# Patient Record
Sex: Female | Born: 1935 | Race: White | Hispanic: No | Smoking: Never smoker
Health system: Southern US, Community
[De-identification: ages and names within clinical notes are randomized; demographics above are authoritative.]

## PROBLEM LIST (undated history)

## (undated) DIAGNOSIS — E669 Obesity, unspecified: Secondary | ICD-10-CM

## (undated) DIAGNOSIS — I2510 Atherosclerotic heart disease of native coronary artery without angina pectoris: Secondary | ICD-10-CM

## (undated) DIAGNOSIS — C44311 Basal cell carcinoma of skin of nose: Secondary | ICD-10-CM

## (undated) DIAGNOSIS — C4370 Malignant melanoma of unspecified lower limb, including hip: Secondary | ICD-10-CM

## (undated) DIAGNOSIS — I10 Essential (primary) hypertension: Secondary | ICD-10-CM

## (undated) DIAGNOSIS — R339 Retention of urine, unspecified: Secondary | ICD-10-CM

## (undated) HISTORY — PX: TONSILLECTOMY: SUR1361

## (undated) HISTORY — PX: BASAL CELL CARCINOMA EXCISION: SHX1214

## (undated) HISTORY — DX: Obesity, unspecified: E66.9

---

## 1998-06-16 ENCOUNTER — Ambulatory Visit (HOSPITAL_COMMUNITY): Admission: RE | Admit: 1998-06-16 | Discharge: 1998-06-16 | Payer: Self-pay | Admitting: Obstetrics & Gynecology

## 1999-04-04 ENCOUNTER — Other Ambulatory Visit: Admission: RE | Admit: 1999-04-04 | Discharge: 1999-04-04 | Payer: Self-pay | Admitting: Internal Medicine

## 2000-04-04 ENCOUNTER — Encounter: Payer: Self-pay | Admitting: Internal Medicine

## 2000-04-04 ENCOUNTER — Encounter: Admission: RE | Admit: 2000-04-04 | Discharge: 2000-04-04 | Payer: Self-pay | Admitting: Internal Medicine

## 2000-09-28 ENCOUNTER — Other Ambulatory Visit: Admission: RE | Admit: 2000-09-28 | Discharge: 2000-09-28 | Payer: Self-pay | Admitting: *Deleted

## 2001-04-04 ENCOUNTER — Encounter: Payer: Self-pay | Admitting: Internal Medicine

## 2001-04-04 ENCOUNTER — Encounter: Admission: RE | Admit: 2001-04-04 | Discharge: 2001-04-04 | Payer: Self-pay | Admitting: Internal Medicine

## 2001-08-30 ENCOUNTER — Encounter: Payer: Self-pay | Admitting: Internal Medicine

## 2001-08-30 ENCOUNTER — Encounter: Admission: RE | Admit: 2001-08-30 | Discharge: 2001-08-30 | Payer: Self-pay | Admitting: Internal Medicine

## 2002-04-08 ENCOUNTER — Encounter: Payer: Self-pay | Admitting: Internal Medicine

## 2002-04-08 ENCOUNTER — Encounter: Admission: RE | Admit: 2002-04-08 | Discharge: 2002-04-08 | Payer: Self-pay | Admitting: Internal Medicine

## 2003-04-08 ENCOUNTER — Other Ambulatory Visit: Admission: RE | Admit: 2003-04-08 | Discharge: 2003-04-08 | Payer: Self-pay | Admitting: Internal Medicine

## 2003-04-10 ENCOUNTER — Encounter: Admission: RE | Admit: 2003-04-10 | Discharge: 2003-04-10 | Payer: Self-pay | Admitting: Internal Medicine

## 2004-04-14 ENCOUNTER — Ambulatory Visit (HOSPITAL_COMMUNITY): Admission: RE | Admit: 2004-04-14 | Discharge: 2004-04-14 | Payer: Self-pay | Admitting: Internal Medicine

## 2005-05-01 ENCOUNTER — Ambulatory Visit (HOSPITAL_COMMUNITY): Admission: RE | Admit: 2005-05-01 | Discharge: 2005-05-01 | Payer: Self-pay | Admitting: Internal Medicine

## 2005-05-12 ENCOUNTER — Ambulatory Visit: Payer: Self-pay | Admitting: Gastroenterology

## 2005-06-08 ENCOUNTER — Ambulatory Visit: Payer: Self-pay | Admitting: Gastroenterology

## 2006-04-02 ENCOUNTER — Other Ambulatory Visit: Admission: RE | Admit: 2006-04-02 | Discharge: 2006-04-02 | Payer: Self-pay | Admitting: Internal Medicine

## 2006-05-04 ENCOUNTER — Ambulatory Visit (HOSPITAL_COMMUNITY): Admission: RE | Admit: 2006-05-04 | Discharge: 2006-05-04 | Payer: Self-pay | Admitting: Internal Medicine

## 2007-05-13 ENCOUNTER — Ambulatory Visit (HOSPITAL_COMMUNITY): Admission: RE | Admit: 2007-05-13 | Discharge: 2007-05-13 | Payer: Self-pay | Admitting: Internal Medicine

## 2008-05-22 ENCOUNTER — Ambulatory Visit (HOSPITAL_COMMUNITY): Admission: RE | Admit: 2008-05-22 | Discharge: 2008-05-22 | Payer: Self-pay | Admitting: Internal Medicine

## 2009-04-07 ENCOUNTER — Other Ambulatory Visit: Admission: RE | Admit: 2009-04-07 | Discharge: 2009-04-07 | Payer: Self-pay | Admitting: Internal Medicine

## 2009-05-24 ENCOUNTER — Ambulatory Visit (HOSPITAL_COMMUNITY): Admission: RE | Admit: 2009-05-24 | Discharge: 2009-05-24 | Payer: Self-pay | Admitting: Internal Medicine

## 2010-05-12 ENCOUNTER — Other Ambulatory Visit (HOSPITAL_COMMUNITY): Payer: Self-pay | Admitting: Internal Medicine

## 2010-05-12 DIAGNOSIS — Z0000 Encounter for general adult medical examination without abnormal findings: Secondary | ICD-10-CM

## 2010-05-27 ENCOUNTER — Ambulatory Visit (HOSPITAL_COMMUNITY): Admission: RE | Admit: 2010-05-27 | Payer: Self-pay | Source: Home / Self Care | Admitting: Internal Medicine

## 2010-05-27 ENCOUNTER — Ambulatory Visit (HOSPITAL_COMMUNITY)
Admission: RE | Admit: 2010-05-27 | Discharge: 2010-05-27 | Disposition: A | Discharge status: Home / Self Care | Payer: Medicare Other | Source: Ambulatory Visit | Attending: Internal Medicine | Admitting: Internal Medicine

## 2010-05-27 DIAGNOSIS — Z1231 Encounter for screening mammogram for malignant neoplasm of breast: Secondary | ICD-10-CM | POA: Insufficient documentation

## 2010-05-27 DIAGNOSIS — Z0000 Encounter for general adult medical examination without abnormal findings: Secondary | ICD-10-CM

## 2011-04-25 HISTORY — PX: MELANOMA EXCISION: SHX5266

## 2011-05-11 ENCOUNTER — Other Ambulatory Visit (HOSPITAL_COMMUNITY): Payer: Self-pay | Admitting: Internal Medicine

## 2011-05-11 DIAGNOSIS — Z1231 Encounter for screening mammogram for malignant neoplasm of breast: Secondary | ICD-10-CM

## 2011-06-09 ENCOUNTER — Ambulatory Visit (HOSPITAL_COMMUNITY)
Admission: RE | Admit: 2011-06-09 | Discharge: 2011-06-09 | Disposition: A | Discharge status: Home / Self Care | Payer: Medicare Other | Source: Ambulatory Visit | Attending: Internal Medicine | Admitting: Internal Medicine

## 2011-06-09 DIAGNOSIS — Z13820 Encounter for screening for osteoporosis: Secondary | ICD-10-CM | POA: Insufficient documentation

## 2011-06-09 DIAGNOSIS — Z780 Asymptomatic menopausal state: Secondary | ICD-10-CM | POA: Insufficient documentation

## 2011-06-09 DIAGNOSIS — Z1231 Encounter for screening mammogram for malignant neoplasm of breast: Secondary | ICD-10-CM

## 2011-08-11 ENCOUNTER — Other Ambulatory Visit: Payer: Self-pay | Admitting: Dermatology

## 2012-05-06 ENCOUNTER — Other Ambulatory Visit (HOSPITAL_COMMUNITY): Payer: Self-pay | Admitting: Internal Medicine

## 2012-05-06 DIAGNOSIS — Z1231 Encounter for screening mammogram for malignant neoplasm of breast: Secondary | ICD-10-CM

## 2012-06-14 ENCOUNTER — Ambulatory Visit (HOSPITAL_COMMUNITY)
Admission: RE | Admit: 2012-06-14 | Discharge: 2012-06-14 | Disposition: A | Discharge status: Home / Self Care | Payer: Medicare Other | Source: Ambulatory Visit | Attending: Internal Medicine | Admitting: Internal Medicine

## 2012-06-14 DIAGNOSIS — Z1231 Encounter for screening mammogram for malignant neoplasm of breast: Secondary | ICD-10-CM | POA: Insufficient documentation

## 2012-09-29 ENCOUNTER — Inpatient Hospital Stay (HOSPITAL_COMMUNITY)
Admission: EM | Admit: 2012-09-29 | Discharge: 2012-10-01 | DRG: 247 | Disposition: A | Discharge status: Home / Self Care | Payer: Medicare Other | Attending: Cardiology | Admitting: Cardiology

## 2012-09-29 ENCOUNTER — Encounter (HOSPITAL_COMMUNITY): Payer: Self-pay | Admitting: *Deleted

## 2012-09-29 ENCOUNTER — Emergency Department (HOSPITAL_COMMUNITY): Payer: Medicare Other

## 2012-09-29 DIAGNOSIS — E876 Hypokalemia: Secondary | ICD-10-CM | POA: Diagnosis present

## 2012-09-29 DIAGNOSIS — I214 Non-ST elevation (NSTEMI) myocardial infarction: Principal | ICD-10-CM | POA: Diagnosis present

## 2012-09-29 DIAGNOSIS — I10 Essential (primary) hypertension: Secondary | ICD-10-CM | POA: Diagnosis present

## 2012-09-29 DIAGNOSIS — Z955 Presence of coronary angioplasty implant and graft: Secondary | ICD-10-CM

## 2012-09-29 DIAGNOSIS — E785 Hyperlipidemia, unspecified: Secondary | ICD-10-CM | POA: Diagnosis present

## 2012-09-29 DIAGNOSIS — I2510 Atherosclerotic heart disease of native coronary artery without angina pectoris: Secondary | ICD-10-CM | POA: Diagnosis present

## 2012-09-29 DIAGNOSIS — Z79899 Other long term (current) drug therapy: Secondary | ICD-10-CM

## 2012-09-29 HISTORY — DX: Malignant melanoma of unspecified lower limb, including hip: C43.70

## 2012-09-29 HISTORY — DX: Basal cell carcinoma of skin of nose: C44.311

## 2012-09-29 HISTORY — DX: Essential (primary) hypertension: I10

## 2012-09-29 HISTORY — DX: Atherosclerotic heart disease of native coronary artery without angina pectoris: I25.10

## 2012-09-29 HISTORY — DX: Retention of urine, unspecified: R33.9

## 2012-09-29 LAB — CBC
MCH: 26.7 pg (ref 26.0–34.0)
MCHC: 32.8 g/dL (ref 30.0–36.0)
MCV: 81.6 fL (ref 78.0–100.0)
Platelets: 208 10*3/uL (ref 150–400)
WBC: 6.7 10*3/uL (ref 4.0–10.5)

## 2012-09-29 LAB — COMPREHENSIVE METABOLIC PANEL
AST: 21 U/L (ref 0–37)
Albumin: 3.2 g/dL — ABNORMAL LOW (ref 3.5–5.2)
BUN: 20 mg/dL (ref 6–23)
Calcium: 9.3 mg/dL (ref 8.4–10.5)
Chloride: 105 mEq/L (ref 96–112)
Creatinine, Ser: 0.89 mg/dL (ref 0.50–1.10)
Total Bilirubin: 0.6 mg/dL (ref 0.3–1.2)
Total Protein: 6.1 g/dL (ref 6.0–8.3)

## 2012-09-29 LAB — LIPASE, BLOOD: Lipase: 32 U/L (ref 11–59)

## 2012-09-29 LAB — BASIC METABOLIC PANEL
BUN: 24 mg/dL — ABNORMAL HIGH (ref 6–23)
Creatinine, Ser: 1.04 mg/dL (ref 0.50–1.10)
GFR calc Af Amer: 57 mL/min — ABNORMAL LOW (ref >90)
GFR calc non Af Amer: 49 mL/min — ABNORMAL LOW (ref >90)
Glucose, Bld: 106 mg/dL — ABNORMAL HIGH (ref 70–99)
Sodium: 140 mEq/L (ref 135–145)

## 2012-09-29 LAB — URINALYSIS, ROUTINE W REFLEX MICROSCOPIC
Glucose, UA: NEGATIVE mg/dL
Hgb urine dipstick: NEGATIVE
Protein, ur: NEGATIVE mg/dL
Specific Gravity, Urine: 1.026 (ref 1.005–1.030)
pH: 6.5 (ref 5.0–8.0)

## 2012-09-29 LAB — HEPARIN LEVEL (UNFRACTIONATED): Heparin Unfractionated: 0.52 IU/mL (ref 0.30–0.70)

## 2012-09-29 LAB — HEPATIC FUNCTION PANEL
ALT: 18 U/L (ref 0–35)
Albumin: 3.6 g/dL (ref 3.5–5.2)
Indirect Bilirubin: 0.6 mg/dL (ref 0.3–0.9)
Total Protein: 6.9 g/dL (ref 6.0–8.3)

## 2012-09-29 LAB — TROPONIN I: Troponin I: <0.3 ng/mL (ref <0.30)

## 2012-09-29 LAB — POCT I-STAT TROPONIN I

## 2012-09-29 LAB — MAGNESIUM: Magnesium: 2 mg/dL (ref 1.5–2.5)

## 2012-09-29 MED ORDER — ASPIRIN EC 81 MG PO TBEC
81.0000 mg | DELAYED_RELEASE_TABLET | Freq: Every day | ORAL | Status: DC
  Administered 2012-10-01: 81 mg via ORAL
  Filled 2012-09-29: qty 1

## 2012-09-29 MED ORDER — ATORVASTATIN CALCIUM 80 MG PO TABS
80.0000 mg | ORAL_TABLET | Freq: Every day | ORAL | Status: DC
  Administered 2012-09-29 – 2012-09-30 (×2): 80 mg via ORAL
  Filled 2012-09-29 (×4): qty 1

## 2012-09-29 MED ORDER — ASPIRIN 300 MG RE SUPP: 300.0000 mg | RECTAL | Status: DC

## 2012-09-29 MED ORDER — SODIUM CHLORIDE 0.9 % IJ SOLN: 3.0000 mL | INTRAMUSCULAR | Status: DC | PRN

## 2012-09-29 MED ORDER — HYDROCHLOROTHIAZIDE 25 MG PO TABS
25.0000 mg | ORAL_TABLET | Freq: Every day | ORAL | Status: DC
  Administered 2012-09-29 – 2012-10-01 (×2): 25 mg via ORAL
  Filled 2012-09-29 (×3): qty 1

## 2012-09-29 MED ORDER — SODIUM CHLORIDE 0.9 % IV SOLN
INTRAVENOUS | Status: DC
  Administered 2012-09-30 (×2): via INTRAVENOUS

## 2012-09-29 MED ORDER — ASPIRIN 81 MG PO CHEW
324.0000 mg | CHEWABLE_TABLET | ORAL | Status: AC
  Administered 2012-09-30: 324 mg via ORAL
  Filled 2012-09-29: qty 4

## 2012-09-29 MED ORDER — ASPIRIN 81 MG PO CHEW
324.0000 mg | CHEWABLE_TABLET | Freq: Once | ORAL | Status: AC
  Administered 2012-09-29: 324 mg via ORAL
  Filled 2012-09-29: qty 4

## 2012-09-29 MED ORDER — ACETAMINOPHEN 325 MG PO TABS: 650.0000 mg | ORAL_TABLET | ORAL | Status: DC | PRN

## 2012-09-29 MED ORDER — HEPARIN BOLUS VIA INFUSION
4000.0000 [IU] | Freq: Once | INTRAVENOUS | Status: AC
  Administered 2012-09-29: 4000 [IU] via INTRAVENOUS

## 2012-09-29 MED ORDER — SODIUM CHLORIDE 0.9 % IJ SOLN
3.0000 mL | Freq: Two times a day (BID) | INTRAMUSCULAR | Status: DC
  Administered 2012-09-29 – 2012-09-30 (×2): 3 mL via INTRAVENOUS

## 2012-09-29 MED ORDER — METOPROLOL TARTRATE 25 MG PO TABS
25.0000 mg | ORAL_TABLET | Freq: Two times a day (BID) | ORAL | Status: DC
  Administered 2012-09-29 – 2012-10-01 (×3): 25 mg via ORAL
  Filled 2012-09-29 (×6): qty 1

## 2012-09-29 MED ORDER — HEPARIN (PORCINE) IN NACL 100-0.45 UNIT/ML-% IJ SOLN
850.0000 [IU]/h | INTRAMUSCULAR | Status: DC
  Administered 2012-09-29: 850 [IU]/h via INTRAVENOUS
  Filled 2012-09-29: qty 250

## 2012-09-29 MED ORDER — NITROGLYCERIN 0.4 MG SL SUBL: 0.4000 mg | SUBLINGUAL_TABLET | SUBLINGUAL | Status: DC | PRN

## 2012-09-29 MED ORDER — ASPIRIN 81 MG PO CHEW: 324.0000 mg | CHEWABLE_TABLET | Freq: Once | ORAL | Status: DC

## 2012-09-29 MED ORDER — ONDANSETRON HCL 4 MG/2ML IJ SOLN: 4.0000 mg | Freq: Four times a day (QID) | INTRAMUSCULAR | Status: DC | PRN

## 2012-09-29 MED ORDER — SODIUM CHLORIDE 0.9 % IV SOLN
250.0000 mL | INTRAVENOUS | Status: DC | PRN
  Administered 2012-09-29: 250 mL via INTRAVENOUS

## 2012-09-29 NOTE — Consult Note (Signed)
ANTICOAGULATION CONSULT NOTE - Follow Up Consult  Pharmacy Consult for Heparin Indication: chest pain/ACS  No Known Allergies  Patient Measurements: Height: 5' 3.5" (161.3 cm) Weight: 165 lb (74.844 kg) IBW/kg (Calculated) : 53.55 Heparin Dosing Weight: ~69kg  Vital Signs: BP: 117/51 mmHg (06/08 1200) Pulse Rate: 56 (06/08 1200)  Labs:  Recent Labs  09/29/12 1020 09/29/12 1031  HGB 13.8  --   HCT 42.1  --   PLT 208  --   CREATININE 1.04  --   TROPONINI  --  0.94*    Estimated Creatinine Clearance: 41.6 ml/min (by C-G formula based on Cr of 1.04).   Medications:  No anticoagulants pta  Assessment: 83yof presents to the ED with CP. Initial troponin is positive. She will begin IV heparin. Baseline renal function and CBC wnl.  Goal of Therapy:  Heparin level 0.3-0.7 units/ml Monitor platelets by anticoagulation protocol: Yes   Plan:  1) Heparin bolus 4000 units x 1 2) Heparin drip at 850 units/hr 3) 8 hour heparin level 4) Daily heparin level and CBC  Fredrik Rigger 09/29/2012,1:15 PM

## 2012-09-29 NOTE — ED Notes (Signed)
Pt reports waking up with a heaviness and discomfort in her chest. No acute distress noted at triage, ekg done.

## 2012-09-29 NOTE — ED Notes (Signed)
Patient transported to X-ray 

## 2012-09-29 NOTE — ED Notes (Signed)
Report attempted 

## 2012-09-29 NOTE — Progress Notes (Signed)
11:11 PM   Heparin level is therapeutic. Xa=0.52. No bleeding noted. Continuing at current rate with f/u am labs 6/9   Janice Coffin

## 2012-09-29 NOTE — ED Notes (Signed)
Critical lab troponin 0.94, Dr Manus Gunning notified.

## 2012-09-29 NOTE — Progress Notes (Signed)
 CARDIOLOGY ADMISSION NOTE  Patient ID: Nancy Wise MRN: [id number] DOB/AGE: 07/15/1929 79 y.o.  Admit date: 09/29/2012 Primary Physician   Dr. Ed Green Primary Cardiologist   None Chief Complaint    Chest pain  HPI:  The patient presented for evaluation of chest discomfort. She has no prior cardiac history. She woke this morning with midsternal chest discomfort. It was a heaviness. As 5/10. She did not describe associated symptoms such as not see a vomiting or diaphoresis. She did not have any palpitations, presyncope or syncope. She did not have radiation to her jaw or to her arms. She's never had discomfort like this before. Because she doesn't complain much her son brought her to the emergency room. Here she has had no EKG changes. However, troponin is slightly elevated at 0.94. She was treated with aspirin and IV heparin in the emergency room. For pain resolved and she has been pain-free since. She's an active person doing "Silver Sneakers".  With this she's had no recent symptoms. She did have some numbness in her arm and leg about a month ago but was evaluated by her primary physician with no clear etiology identified and no further workup was needed.   Past Medical History  Diagnosis Date  . Hypertension   . Bladder retention     History reviewed. No pertinent past surgical history.  No Known Allergies No current facility-administered medications on file prior to encounter.   No current outpatient prescriptions on file prior to encounter.   Prior to Admission medications   Medication Sig Start Date End Date Taking? Authorizing Provider  Calcium Carbonate-Vitamin D (CALCIUM + D PO) Take 1 tablet by mouth daily.   Yes Historical Provider, MD  hydrochlorothiazide (HYDRODIURIL) 25 MG tablet Take 25 mg by mouth daily.   Yes Historical Provider, MD  Multiple Vitamin (MULTIVITAMIN) tablet Take 1 tablet by mouth daily.   Yes Historical Provider, MD  verapamil (COVERA HS) 240 MG  (CO) 24 hr tablet Take 240 mg by mouth daily.   Yes Historical Provider, MD     History   Social History  . Marital Status: Widowed    Spouse Name: N/A    Number of Children: N/A  . Years of Education: N/A   Occupational History  . Not on file.   Social History Main Topics  . Smoking status: Not on file  . Smokeless tobacco: Not on file  . Alcohol Use: No  . Drug Use: No  . Sexually Active: Not on file   Other Topics Concern  . Not on file   Social History Narrative  . No narrative on file    History reviewed. No pertinent family history.   ROS:  As stated in the HPI and negative for all other systems.  Physical Exam: Blood pressure 118/42, pulse 57, resp. rate 16, height 5' 3.5" (1.613 m), weight 165 lb (74.844 kg), SpO2 93.00%.  GENERAL:  Well appearing HEENT:  Pupils equal round and reactive, fundi not visualized, oral mucosa unremarkable NECK:  No jugular venous distention, waveform within normal limits, carotid upstroke brisk and symmetric, no bruits, no thyromegaly LYMPHATICS:  No cervical, inguinal adenopathy LUNGS:  Clear to auscultation bilaterally BACK:  No CVA tenderness CHEST:  Unremarkable HEART:  PMI not displaced or sustained,S1 and S2 within normal limits, no S3, no S4, no clicks, no rubs, no murmurs ABD:  Flat, positive bowel sounds normal in frequency in pitch, no bruits, no rebound, no guarding, no midline pulsatile mass,   no hepatomegaly, no splenomegaly EXT:  2 plus pulses throughout, no edema, no cyanosis no clubbing SKIN:  No rashes no nodules NEURO:  Cranial nerves II through XII grossly intact, motor grossly intact throughout PSYCH:  Cognitively intact, oriented to person place and time  Labs: Lab Results  Component Value Date   BUN 24* 09/29/2012   Lab Results  Component Value Date   CREATININE 1.04 09/29/2012   Lab Results  Component Value Date   NA 140 09/29/2012   K 3.3* 09/29/2012   CL 102 09/29/2012   CO2 29 09/29/2012   Lab Results    Component Value Date   TROPONINI 0.94* 09/29/2012   Lab Results  Component Value Date   WBC 6.7 09/29/2012   HGB 13.8 09/29/2012   HCT 42.1 09/29/2012   MCV 81.6 09/29/2012   PLT 208 09/29/2012   No results found for this basename: CHOL, HDL, LDLCALC, LDLDIRECT, TRIG, CHOLHDL   Lab Results  Component Value Date   ALT 18 09/29/2012   AST 22 09/29/2012   ALKPHOS 55 09/29/2012   BILITOT 0.7 09/29/2012    Radiology:  CXR:  1. Bronchitic changes. 2. No focal pulmonary abnormality.  EKG:  Sinus rhythm, rate 63, axis within normal limits, intervals within normal limits, no acute ST-T wave changes.  ASSESSMENT AND PLAN:    NQWMI:  Pain is consistent with unstable angina. Troponins are elevated. She will be admitted with heparin, aspirin. She's currently pain free so she will need nitrates unless she has recurrent symptoms. Cardiac catheterization. The patient understands that risks included but are not limited to stroke (1 in 1000), death (1 in 1000), kidney failure [usually temporary] (1 in 500), bleeding (1 in 200), allergic reaction [possibly serious] (1 in 200).  The patient understands and agrees to proceed.   HTN:  I'm going to hold her calcium channel blocker for now in favor of the beta blocker.  Signed: Syrita Dovel 09/29/2012, 2:27 PM     

## 2012-09-29 NOTE — ED Provider Notes (Signed)
History     CSN: [id number]  Arrival date & time 09/29/12  8295   First MD Initiated Contact with Patient 09/29/12 1010      Chief Complaint  Patient presents with  . Chest Pain    (Consider location/radiation/quality/duration/timing/severity/associated sxs/prior treatment) HPI Comments: Patient presents with chest "heaviness" and discomfort that started this morning and has been constant for the past 2 hours. She denies any nausea, vomiting, diaphoresis, shortness of breath or cough. Nothing makes the pain is better or worse. She reports no cardiac history is never had a stress test. She has a past medical history of hypertension only. She does not smoke. She reports over the past several weeks she said intermittent pain in her left shoulder and breast area that comes and goes. Denies any leg pain or swelling. Denies any cough, congestion, fever chills.  The history is provided by the patient and a relative.    Past Medical History  Diagnosis Date  . Hypertension   . Bladder retention     History reviewed. No pertinent past surgical history.  History reviewed. No pertinent family history.  History  Substance Use Topics  . Smoking status: Not on file  . Smokeless tobacco: Not on file  . Alcohol Use: No    OB History   Grav Para Term Preterm Abortions TAB SAB Ect Mult Living                  Review of Systems  Constitutional: Negative for fever, activity change and appetite change.  HENT: Negative for congestion and rhinorrhea.   Respiratory: Positive for chest tightness and shortness of breath. Negative for cough.   Cardiovascular: Negative for chest pain.  Gastrointestinal: Negative for nausea, vomiting and abdominal pain.  Genitourinary: Negative for dysuria, hematuria, vaginal bleeding and vaginal discharge.  Musculoskeletal: Negative for back pain.  Neurological: Negative for dizziness, weakness and headaches.  A complete 10 system review of systems was obtained  and all systems are negative except as noted in the HPI and PMH.    Allergies  Review of patient's allergies indicates no known allergies.  Home Medications   Current Outpatient Rx  Name  Route  Sig  Dispense  Refill  . Calcium Carbonate-Vitamin D (CALCIUM + D PO)   Oral   Take 1 tablet by mouth daily.         . hydrochlorothiazide (HYDRODIURIL) 25 MG tablet   Oral   Take 25 mg by mouth daily.         . Multiple Vitamin (MULTIVITAMIN) tablet   Oral   Take 1 tablet by mouth daily.         . verapamil (COVERA HS) 240 MG (CO) 24 hr tablet   Oral   Take 240 mg by mouth daily.           BP 103/56  Pulse 54  Resp 12  SpO2 98%  Physical Exam  Constitutional: She is oriented to person, place, and time. She appears well-developed and well-nourished. No distress.  HENT:  Head: Normocephalic and atraumatic.  Mouth/Throat: Oropharynx is clear and moist. No oropharyngeal exudate.  Eyes: Conjunctivae and EOM are normal. Pupils are equal, round, and reactive to light.  Neck: Normal range of motion. Neck supple.  Cardiovascular: Normal rate, regular rhythm and normal heart sounds.   No murmur heard. Pulmonary/Chest: Effort normal and breath sounds normal. No respiratory distress.  Abdominal: Soft. There is no tenderness. There is no rebound and no guarding.  Musculoskeletal: Normal range of motion. She exhibits no edema and no tenderness.  Neurological: She is alert and oriented to person, place, and time. No cranial nerve deficit. She exhibits normal muscle tone. Coordination normal.  Skin: Skin is warm.    ED Course  Procedures (including critical care time)  Labs Reviewed  BASIC METABOLIC PANEL - Abnormal; Notable for the following:    Potassium 3.3 (*)    Glucose, Bld 106 (*)    BUN 24 (*)    GFR calc non Af Amer 49 (*)    GFR calc Af Amer 57 (*)    All other components within normal limits  CBC - Abnormal; Notable for the following:    RBC 5.16 (*)    All  other components within normal limits  TROPONIN I - Abnormal; Notable for the following:    Troponin I 0.94 (*)    All other components within normal limits  HEPATIC FUNCTION PANEL  LIPASE, BLOOD  URINALYSIS, ROUTINE W REFLEX MICROSCOPIC  POCT I-STAT TROPONIN I   Dg Chest 2 View  09/29/2012   *RADIOLOGY REPORT*  Clinical Data: Chest pain, pressure.  History of hypertension.  CHEST - 2 VIEW  Comparison: None.  Findings: Heart size is normal.  There are no focal consolidations or pleural effusions.  No pulmonary edema.  Perihilar peribronchial changes are noted.  Moderate mid thoracic and upper lumbar degenerative changes are present.  IMPRESSION:  1.  Bronchitic changes. 2. No focal pulmonary abnormality.   Original Report Authenticated By: Norva Pavlov, M.D.     1. NSTEMI (non-ST elevated myocardial infarction)       MDM  2 hour history of chest heaviness that is unchanged. No cardiac history. EKG shows no acute ischemia.  EKG shows no acute ischemic changes. Patient appears comfortable. She is given aspirin on arrival.  Troponin positive at 0.9. Heparin bolus and drip started. Patient states is pain-free at this point. Cardiology consult to Dr. Antoine Poche who will see patient for NSTEMI.    Date: 09/29/2012  Rate: 63  Rhythm: normal sinus rhythm  QRS Axis: normal  Intervals: normal  ST/T Wave abnormalities: normal  Conduction Disutrbances:none  Narrative Interpretation:   Old EKG Reviewed: none available  CRITICAL CARE Performed by: Glynn Octave Total critical care time: 30 Critical care time was exclusive of separately billable procedures and treating other patients. Critical care was necessary to treat or prevent imminent or life-threatening deterioration. Critical care was time spent personally by me on the following activities: development of treatment plan with patient and/or surrogate as well as nursing, discussions with consultants, evaluation of patient's  response to treatment, examination of patient, obtaining history from patient or surrogate, ordering and performing treatments and interventions, ordering and review of laboratory studies, ordering and review of radiographic studies, pulse oximetry and re-evaluation of patient's condition.   Glynn Octave, MD 09/29/12 [phone]

## 2012-09-30 ENCOUNTER — Encounter (HOSPITAL_COMMUNITY)
Admission: EM | Disposition: A | Discharge status: Home / Self Care | Payer: Self-pay | Source: Home / Self Care | Attending: Cardiology

## 2012-09-30 ENCOUNTER — Encounter (HOSPITAL_COMMUNITY): Payer: Self-pay | Admitting: General Practice

## 2012-09-30 DIAGNOSIS — I214 Non-ST elevation (NSTEMI) myocardial infarction: Secondary | ICD-10-CM

## 2012-09-30 DIAGNOSIS — I2510 Atherosclerotic heart disease of native coronary artery without angina pectoris: Secondary | ICD-10-CM

## 2012-09-30 HISTORY — PX: CORONARY ANGIOPLASTY WITH STENT PLACEMENT: SHX49

## 2012-09-30 HISTORY — PX: PERCUTANEOUS CORONARY STENT INTERVENTION (PCI-S): SHX5485

## 2012-09-30 HISTORY — PX: LEFT HEART CATHETERIZATION WITH CORONARY ANGIOGRAM: SHX5451

## 2012-09-30 LAB — CBC
HCT: 38.3 % (ref 36.0–46.0)
Hemoglobin: 12.8 g/dL (ref 12.0–15.0)
MCV: 80.5 fL (ref 78.0–100.0)
RBC: 4.76 MIL/uL (ref 3.87–5.11)
RDW: 13.9 % (ref 11.5–15.5)
WBC: 6.3 10*3/uL (ref 4.0–10.5)

## 2012-09-30 LAB — BASIC METABOLIC PANEL
BUN: 21 mg/dL (ref 6–23)
CO2: 27 mEq/L (ref 19–32)
Chloride: 104 mEq/L (ref 96–112)
Creatinine, Ser: 0.81 mg/dL (ref 0.50–1.10)
GFR calc Af Amer: 77 mL/min — ABNORMAL LOW (ref >90)
Potassium: 3.3 mEq/L — ABNORMAL LOW (ref 3.5–5.1)

## 2012-09-30 LAB — TROPONIN I
Troponin I: <0.3 ng/mL (ref <0.30)
Troponin I: <0.3 ng/mL (ref <0.30)

## 2012-09-30 LAB — LIPID PANEL
LDL Cholesterol: 109 mg/dL — ABNORMAL HIGH (ref 0–99)
Total CHOL/HDL Ratio: 2.9 RATIO
VLDL: 11 mg/dL (ref 0–40)

## 2012-09-30 LAB — URINE CULTURE: Colony Count: 6000

## 2012-09-30 LAB — TSH: TSH: 1.35 u[IU]/mL (ref 0.350–4.500)

## 2012-09-30 SURGERY — LEFT HEART CATHETERIZATION WITH CORONARY ANGIOGRAM
Anesthesia: LOCAL

## 2012-09-30 MED ORDER — CLOPIDOGREL BISULFATE 75 MG PO TABS
75.0000 mg | ORAL_TABLET | Freq: Every day | ORAL | Status: DC
  Administered 2012-10-01: 09:00:00 75 mg via ORAL
  Filled 2012-09-30 (×2): qty 1

## 2012-09-30 MED ORDER — HEART ATTACK BOUNCING BOOK
Freq: Once | Status: AC
  Administered 2012-09-30: 22:00:00
  Filled 2012-09-30: qty 1

## 2012-09-30 MED ORDER — STUDY - INVESTIGATIONAL DRUG SIMPLE RECORD
1.7500 mg/kg/h | Freq: Once | Status: DC
  Filled 2012-09-30: qty 250

## 2012-09-30 MED ORDER — FENTANYL CITRATE 0.05 MG/ML IJ SOLN
INTRAMUSCULAR | Status: AC
  Filled 2012-09-30: qty 2

## 2012-09-30 MED ORDER — HEPARIN (PORCINE) IN NACL 2-0.9 UNIT/ML-% IJ SOLN
INTRAMUSCULAR | Status: AC
  Filled 2012-09-30: qty 1000

## 2012-09-30 MED ORDER — STUDY - INVESTIGATIONAL DRUG SIMPLE RECORD
0.7500 mg/kg | Freq: Once | Status: DC
  Filled 2012-09-30: qty 56.1

## 2012-09-30 MED ORDER — MIDAZOLAM HCL 2 MG/2ML IJ SOLN
INTRAMUSCULAR | Status: AC
  Filled 2012-09-30: qty 2

## 2012-09-30 MED ORDER — HEPARIN SODIUM (PORCINE) 1000 UNIT/ML IJ SOLN
INTRAMUSCULAR | Status: AC
  Filled 2012-09-30: qty 1

## 2012-09-30 MED ORDER — LIDOCAINE HCL (PF) 1 % IJ SOLN
INTRAMUSCULAR | Status: AC
  Filled 2012-09-30: qty 30

## 2012-09-30 MED ORDER — SODIUM CHLORIDE 0.9 % IV SOLN
1.0000 mL/kg/h | INTRAVENOUS | Status: AC
  Administered 2012-09-30: 1 mL/kg/h via INTRAVENOUS

## 2012-09-30 MED ORDER — CLOPIDOGREL BISULFATE 300 MG PO TABS
ORAL_TABLET | ORAL | Status: AC
  Filled 2012-09-30: qty 2

## 2012-09-30 MED ORDER — POTASSIUM CHLORIDE CRYS ER 20 MEQ PO TBCR
40.0000 meq | EXTENDED_RELEASE_TABLET | Freq: Every day | ORAL | Status: DC
  Administered 2012-09-30: 40 meq via ORAL
  Filled 2012-09-30 (×2): qty 2

## 2012-09-30 MED ORDER — VERAPAMIL HCL 2.5 MG/ML IV SOLN
INTRAVENOUS | Status: AC
  Filled 2012-09-30: qty 2

## 2012-09-30 NOTE — Progress Notes (Signed)
Utilization Review Completed Aldean Suddeth J. Deloria Brassfield, RN, BSN, NCM [phone]  

## 2012-09-30 NOTE — CV Procedure (Addendum)
   Cardiac Catheterization Procedure Note  Name: Nancy Wise MRN: [id number] DOB: 08/31/1929  Procedure: Left Heart Cath, Selective Coronary Angiography, LV angiography, PTCA and stenting of the mid LAD  Indication: 79 yo WF with history of HTN presents with NSTEMI. She was previously on one antianginal agent.   Procedural Details:  The right wrist was prepped, draped, and anesthetized with 1% lidocaine. Using the modified Seldinger technique, a 5 French sheath was introduced into the right radial artery. 3 mg of verapamil was administered through the sheath, weight-based unfractionated heparin was administered intravenously. Standard Judkins catheters were used for selective coronary angiography and left ventriculography. Catheter exchanges were performed over an exchange length guidewire.  PROCEDURAL FINDINGS Hemodynamics: AO 131/59 mean 87 mm Hg LV 134/10 mm Hg   Coronary angiography: Coronary dominance: right  Left mainstem: Normal  Left anterior descending (LAD): The LAD gives rise to a very large diagonal branch. There is a focal 80-90% mid LAD stenosis. The diagonal is without significant disease.  Left circumflex (LCx): Normal  Right coronary artery (RCA): Diffuse 30-40% disease in the PLOM. 30% disease in the distal PCA.  Left ventriculography: Left ventricular systolic function is normal, LVEF is estimated at 55-65%, there is no significant mitral regurgitation   PCI Note:  Following the diagnostic procedure, the decision was made to proceed with PCI.  The patient was enrolled in the Regulate trial and was randomized to the Bivalirudin arm.  Once a therapeutic ACT was achieved, a 6 [place] XBLAD 3.5  guide catheter was inserted.  A prowater coronary guidewire was used to cross the lesion.  The lesion was predilated with a 2.0 mm balloon.  The lesion was then stented with a 2.25 x 16 mm Promus premier stent.    Following PCI, there was 0% residual stenosis and TIMI-3 flow.  Final angiography confirmed an excellent result. The patient tolerated the procedure well. There were no immediate procedural complications. A TR band was used for radial hemostasis. The patient was transferred to the post catheterization recovery area for further monitoring.  PCI Data: Vessel - LAD/Segment - mid Percent Stenosis (pre)  80-90% TIMI-flow 3 Stent 2.25 x 16 mm Promus premier Percent Stenosis (post) 0% TIMI-flow (post) 3  Final Conclusions:   1. Single vessel obstructive CAD 2. Normal LV function 3. Successful stenting of the mid LAD with DES.   Recommendations:  Dual antiplatelet therapy for one year. Anticipate DC in am.  Theron Arista [facility] 09/30/2012, 10:09 AM

## 2012-09-30 NOTE — Research (Signed)
Regulate PCI Informed Consent   Subject Name: Nancy Wise  Subject met inclusion and exclusion criteria.  The informed consent form, study requirements and expectations were reviewed with the subject and questions and concerns were addressed prior to the signing of the consent form.  The subject verbalized understanding of the trail requirements.  The subject agreed to participate in the Regulate PCI trial and signed the informed consent.  The informed consent was obtained prior to performance of any protocol-specific procedures for the subject.  A copy of the signed informed consent was given to the subject and a copy was placed in the subject's medical record.  [name]. 09/30/2012, [id number]

## 2012-09-30 NOTE — Interval H&P Note (Signed)
History and Physical Interval Note:  09/30/2012 9:12 AM  Nancy Wise  has presented today for surgery, with the diagnosis of cp  The various methods of treatment have been discussed with the patient and family. After consideration of risks, benefits and other options for treatment, the patient has consented to  Procedure(s): LEFT HEART CATHETERIZATION WITH CORONARY ANGIOGRAM (N/A) as a surgical intervention .  The patient's history has been reviewed, patient examined, no change in status, stable for surgery.  I have reviewed the patient's chart and labs.  Questions were answered to the patient's satisfaction.     Theron Arista [facility] 09/30/2012 9:12 AM

## 2012-09-30 NOTE — H&P (View-Only) (Signed)
CARDIOLOGY ADMISSION NOTE  Patient ID: DELISA FINCK MRN: [id number] DOB/AGE: 10/12/1929 79 y.o.  Admit date: 09/29/2012 Primary Physician   Dr. Elmore Guise Primary Cardiologist   None Chief Complaint    Chest pain  HPI:  The patient presented for evaluation of chest discomfort. She has no prior cardiac history. She woke this morning with midsternal chest discomfort. It was a heaviness. As 5/10. She did not describe associated symptoms such as not see a vomiting or diaphoresis. She did not have any palpitations, presyncope or syncope. She did not have radiation to her jaw or to her arms. She's never had discomfort like this before. Because she doesn't complain much her son brought her to the emergency room. Here she has had no EKG changes. However, troponin is slightly elevated at 0.94. She was treated with aspirin and IV heparin in the emergency room. For pain resolved and she has been pain-free since. She's an active person doing "Silver Sneakers".  With this she's had no recent symptoms. She did have some numbness in her arm and leg about a month ago but was evaluated by her primary physician with no clear etiology identified and no further workup was needed.   Past Medical History  Diagnosis Date  . Hypertension   . Bladder retention     History reviewed. No pertinent past surgical history.  No Known Allergies No current facility-administered medications on file prior to encounter.   No current outpatient prescriptions on file prior to encounter.   Prior to Admission medications   Medication Sig Start Date End Date Taking? Authorizing Provider  Calcium Carbonate-Vitamin D (CALCIUM + D PO) Take 1 tablet by mouth daily.   Yes Historical Provider, MD  hydrochlorothiazide (HYDRODIURIL) 25 MG tablet Take 25 mg by mouth daily.   Yes Historical Provider, MD  Multiple Vitamin (MULTIVITAMIN) tablet Take 1 tablet by mouth daily.   Yes Historical Provider, MD  verapamil (COVERA HS) 240 MG  (CO) 24 hr tablet Take 240 mg by mouth daily.   Yes Historical Provider, MD     History   Social History  . Marital Status: Widowed    Spouse Name: N/A    Number of Children: N/A  . Years of Education: N/A   Occupational History  . Not on file.   Social History Main Topics  . Smoking status: Not on file  . Smokeless tobacco: Not on file  . Alcohol Use: No  . Drug Use: No  . Sexually Active: Not on file   Other Topics Concern  . Not on file   Social History Narrative  . No narrative on file    History reviewed. No pertinent family history.   ROS:  As stated in the HPI and negative for all other systems.  Physical Exam: Blood pressure 118/42, pulse 57, resp. rate 16, height 5' 3.5" (1.613 m), weight 165 lb (74.844 kg), SpO2 93.00%.  GENERAL:  Well appearing HEENT:  Pupils equal round and reactive, fundi not visualized, oral mucosa unremarkable NECK:  No jugular venous distention, waveform within normal limits, carotid upstroke brisk and symmetric, no bruits, no thyromegaly LYMPHATICS:  No cervical, inguinal adenopathy LUNGS:  Clear to auscultation bilaterally BACK:  No CVA tenderness CHEST:  Unremarkable HEART:  PMI not displaced or sustained,S1 and S2 within normal limits, no S3, no S4, no clicks, no rubs, no murmurs ABD:  Flat, positive bowel sounds normal in frequency in pitch, no bruits, no rebound, no guarding, no midline pulsatile mass,  no hepatomegaly, no splenomegaly EXT:  2 plus pulses throughout, no edema, no cyanosis no clubbing SKIN:  No rashes no nodules NEURO:  Cranial nerves II through XII grossly intact, motor grossly intact throughout PSYCH:  Cognitively intact, oriented to person place and time  Labs: Lab Results  Component Value Date   BUN 24* 09/29/2012   Lab Results  Component Value Date   CREATININE 1.04 09/29/2012   Lab Results  Component Value Date   NA 140 09/29/2012   K 3.3* 09/29/2012   CL 102 09/29/2012   CO2 29 09/29/2012   Lab Results    Component Value Date   TROPONINI 0.94* 09/29/2012   Lab Results  Component Value Date   WBC 6.7 09/29/2012   HGB 13.8 09/29/2012   HCT 42.1 09/29/2012   MCV 81.6 09/29/2012   PLT 208 09/29/2012   No results found for this basename: CHOL, HDL, LDLCALC, LDLDIRECT, TRIG, CHOLHDL   Lab Results  Component Value Date   ALT 18 09/29/2012   AST 22 09/29/2012   ALKPHOS 55 09/29/2012   BILITOT 0.7 09/29/2012    Radiology:  CXR:  1. Bronchitic changes. 2. No focal pulmonary abnormality.  EKG:  Sinus rhythm, rate 63, axis within normal limits, intervals within normal limits, no acute ST-T wave changes.  ASSESSMENT AND PLAN:    NQWMI:  Pain is consistent with unstable angina. Troponins are elevated. She will be admitted with heparin, aspirin. She's currently pain free so she will need nitrates unless she has recurrent symptoms. Cardiac catheterization. The patient understands that risks included but are not limited to stroke (1 in 1000), death (1 in 1000), kidney failure [usually temporary] (1 in 500), bleeding (1 in 200), allergic reaction [possibly serious] (1 in 200).  The patient understands and agrees to proceed.   HTN:  I'm going to hold her calcium channel blocker for now in favor of the beta blocker.  SignedRollene Rotunda 09/29/2012, 2:27 PM

## 2012-09-30 NOTE — Progress Notes (Signed)
TR BAND REMOVAL  LOCATION:  right radial  DEFLATED PER PROTOCOL:  yes  TIME BAND OFF / DRESSING APPLIED:   1800   SITE UPON ARRIVAL:   Level 1  SITE AFTER BAND REMOVAL:  Level 1  REVERSE ALLEN'S TEST:    positive  CIRCULATION SENSATION AND MOVEMENT:  Within Normal Limits  yes  COMMENTS:

## 2012-09-30 NOTE — Progress Notes (Signed)
Patient ID: Nancy Wise, female   DOB: 12-08-1929, 79 y.o.   MRN: [id number]   Pt. For cath. Potassium has been ordered.  Jerral Bonito, MD

## 2012-10-01 ENCOUNTER — Encounter (HOSPITAL_COMMUNITY): Payer: Self-pay | Admitting: Nurse Practitioner

## 2012-10-01 DIAGNOSIS — E876 Hypokalemia: Secondary | ICD-10-CM

## 2012-10-01 DIAGNOSIS — I2510 Atherosclerotic heart disease of native coronary artery without angina pectoris: Secondary | ICD-10-CM

## 2012-10-01 DIAGNOSIS — I10 Essential (primary) hypertension: Secondary | ICD-10-CM

## 2012-10-01 DIAGNOSIS — I214 Non-ST elevation (NSTEMI) myocardial infarction: Secondary | ICD-10-CM

## 2012-10-01 LAB — CBC
MCH: 26.8 pg (ref 26.0–34.0)
MCHC: 33.2 g/dL (ref 30.0–36.0)
Platelets: 178 10*3/uL (ref 150–400)

## 2012-10-01 LAB — BASIC METABOLIC PANEL
Calcium: 8.8 mg/dL (ref 8.4–10.5)
Creatinine, Ser: 0.88 mg/dL (ref 0.50–1.10)
GFR calc non Af Amer: 60 mL/min — ABNORMAL LOW (ref >90)
Glucose, Bld: 94 mg/dL (ref 70–99)
Sodium: 143 mEq/L (ref 135–145)

## 2012-10-01 LAB — POCT ACTIVATED CLOTTING TIME: Activated Clotting Time: 649 seconds

## 2012-10-01 MED ORDER — NITROGLYCERIN 0.4 MG SL SUBL: 0.4000 mg | SUBLINGUAL_TABLET | SUBLINGUAL | Status: DC | PRN

## 2012-10-01 MED ORDER — POTASSIUM CHLORIDE CRYS ER 20 MEQ PO TBCR
20.0000 meq | EXTENDED_RELEASE_TABLET | Freq: Every day | ORAL | Status: DC
  Administered 2012-10-01: 20 meq via ORAL

## 2012-10-01 MED ORDER — METOPROLOL TARTRATE 25 MG PO TABS: 25.0000 mg | ORAL_TABLET | Freq: Two times a day (BID) | ORAL | Status: DC

## 2012-10-01 MED ORDER — ASPIRIN 81 MG PO TBEC: 81.0000 mg | DELAYED_RELEASE_TABLET | Freq: Every day | ORAL | Status: DC

## 2012-10-01 MED ORDER — CLOPIDOGREL BISULFATE 75 MG PO TABS: 75.0000 mg | ORAL_TABLET | Freq: Every day | ORAL | Status: DC

## 2012-10-01 MED ORDER — ACTIVE PARTNERSHIP FOR HEALTH OF YOUR HEART BOOK
Freq: Once | Status: AC
  Administered 2012-10-01: 09:00:00
  Filled 2012-10-01 (×2): qty 1

## 2012-10-01 MED ORDER — ATORVASTATIN CALCIUM 80 MG PO TABS: 80.0000 mg | ORAL_TABLET | Freq: Every day | ORAL | Status: DC

## 2012-10-01 MED ORDER — POTASSIUM CHLORIDE CRYS ER 20 MEQ PO TBCR: 40.0000 meq | EXTENDED_RELEASE_TABLET | Freq: Every day | ORAL | Status: DC

## 2012-10-01 NOTE — Progress Notes (Signed)
CARDIAC REHAB PHASE I   PRE:  Rate/Rhythm: 73 SR PJC's  BP:  Supine:   Sitting: 150/51  Standing:    SaO2:   MODE:  Ambulation: 1000 ft   POST:  Rate/Rhythm: 106 ST PJC's  BP:  Supine:   Sitting: 140/65  Standing:    SaO2:  4098-1191 Pt tolerated ambulation well without c/o of cp or SOB. VS stable. Completed MI and stent education with pt. She voices understanding. Pt agrees to Outpt. CRP in [place], will send referral.  Melina Copa RN 10/01/2012 9:26 AM

## 2012-10-01 NOTE — Discharge Summary (Signed)
Patient seen and examined and history reviewed. Agree with above findings and plan. See earlier rounding note.  Girl Schissler JordanMD 10/01/2012 4:48 PM    

## 2012-10-01 NOTE — Progress Notes (Signed)
Patient Name: Nancy Wise Date of Encounter: 10/01/2012   Principal Problem:   NSTEMI (non-ST elevated myocardial infarction) Active Problems:   CAD (coronary artery disease)   Hypertension   Hypokalemia    SUBJECTIVE  No chest pain or sob.  Has not ambulated much yet.  CURRENT MEDS . active partnership for health of your heart book   Does not apply Once  . aspirin EC  81 mg Oral Daily  . atorvastatin  80 mg Oral q1800  . clopidogrel  75 mg Oral Q breakfast  . hydrochlorothiazide  25 mg Oral Daily  . metoprolol tartrate  25 mg Oral BID  . potassium chloride  40 mEq Oral Daily   OBJECTIVE  Filed Vitals:   09/30/12 1649 09/30/12 2044 10/01/12 0025 10/01/12 0521  BP: 113/39 105/46 117/39 99/36  Pulse: 59 67 65 68  Temp: 97.5 F (36.4 C) 97.7 F (36.5 C) 97.9 F (36.6 C) 97.6 F (36.4 C)  TempSrc: Oral Oral Oral Oral  Resp: 18 18 15 18   Height:      Weight:   165 lb 5.5 oz (75 kg)   SpO2: 95% 94% 95% 95%    Intake/Output Summary (Last 24 hours) at 10/01/12 0653 Last data filed at 10/01/12 0531  Gross per 24 hour  Intake 1374.5 ml  Output   1650 ml  Net -275.5 ml   Filed Weights   09/29/12 1300 09/29/12 1718 10/01/12 0025  Weight: 165 lb (74.844 kg) 164 lb 14.5 oz (74.8 kg) 165 lb 5.5 oz (75 kg)    PHYSICAL EXAM  General: Pleasant, NAD. Neuro: Alert and oriented X 3. Moves all extremities spontaneously. Psych: Normal affect. HEENT:  Normal  Neck: Supple without bruits or JVD. Lungs:  Resp regular and unlabored, CTA. Heart: RRR no s3, s4, or murmurs. Abdomen: Soft, non-tender, non-distended, BS + x 4.  Extremities: No clubbing, cyanosis or edema. DP/PT/Radials 2+ and equal bilaterally.  R wrist ecchymotic w/o bleeding, bruit, hematoma.  Nl Allen's.  Accessory Clinical Findings  CBC  Recent Labs  09/30/12 0355 10/01/12 0605  WBC 6.3 6.6  HGB 12.8 12.7  HCT 38.3 38.2  MCV 80.5 80.8  PLT 191 178   Basic Metabolic Panel  Recent Labs  09/29/12 1020 09/29/12 1815 09/30/12 0355  NA 140 141 140  K 3.3* 3.3* 3.3*  CL 102 105 104  CO2 29 30 27   GLUCOSE 106* 114* 95  BUN 24* 20 21  CREATININE 1.04 0.89 0.81  CALCIUM 9.1 9.3 9.1  MG  --  2.0  --    Liver Function Tests  Recent Labs  09/29/12 1020 09/29/12 1815  AST 22 21  ALT 18 16  ALKPHOS 55 48  BILITOT 0.7 0.6  PROT 6.9 6.1  ALBUMIN 3.6 3.2*    Recent Labs  09/29/12 1020  LIPASE 32   Cardiac Enzymes  Recent Labs  09/29/12 1831 09/29/12 2328 09/30/12 0355  TROPONINI <0.30 <0.30 <0.30   Fasting Lipid Panel  Recent Labs  09/30/12 0355  CHOL 183  HDL 63  LDLCALC 109*  TRIG 56  CHOLHDL 2.9   Thyroid Function Tests  Recent Labs  09/29/12 1815  TSH 1.350   TELE  Rsr, frequent pac's.  ECG  Rsr, 63, PAC, twi III, V3-V5 - not significantly changed.  Radiology/Studies  Dg Chest 2 View  09/29/2012   *RADIOLOGY REPORT*  Clinical Data: Chest pain, pressure.  History of hypertension.  CHEST - 2 VIEW  Comparison:  None.  Findings: Heart size is normal.  There are no focal consolidations or pleural effusions.  No pulmonary edema.  Perihilar peribronchial changes are noted.  Moderate mid thoracic and upper lumbar degenerative changes are present.  IMPRESSION:  1.  Bronchitic changes. 2. No focal pulmonary abnormality.   Original Report Authenticated By: Norva Pavlov, M.D.    ASSESSMENT AND PLAN  1.  NSTEMI/CAD:  S/p cath revealing severe LAD dzs, which was successfully stented with a DES.  She has had no recurrent chest pain.  Ambulate with cardiac rehab this AM and plan for d/c afterwards.  Continue asa, statin, plavix, bb.  2.  HTN:  Stable.  Cont bb & HCTZ.  Previously on verapamil -> d/c'd in setting of above with need for bb.  3.  HL:  LDL 109.  Previously statin naive.  Cont high potency lipitor.  4.  Hypokalemia:  bmet pending this AM.  She is now on daily KCl.  Signed, Nicolasa Ducking NP Patient seen and examined and  history reviewed. Agree with above findings and plan. Patient doing very well without recurrent angina. Encouraged outpatient cardiac Rehab program. BMET today is normal. Would discharge on KCL 20 meq daily. Plan follow up in office in 2 weeks.  Theron Arista [facility] 10/01/2012 9:29 AM

## 2012-10-01 NOTE — Discharge Summary (Signed)
Patient ID: Nancy Wise,  MRN: [id number], DOB/AGE: 77-Feb-1931 77 y.o.  Admit date: 09/29/2012 Discharge date: 10/01/2012  Primary Care Provider: Mila Palmer, MD Primary Cardiologist: J. Hochrein, MD   Discharge Diagnoses Principal Problem:   NSTEMI (non-ST elevated myocardial infarction)  **s/p Cath/PCI this admission with stenting of the LAD with a 2.5 x 16 mm Promus Premier DES.  Active Problems:   CAD (coronary artery disease)   Hypertension   Hypokalemia  **Supplemented this admission.  Allergies No Known Allergies  Procedures  Cardiac Catheterization and Percutaneous Coronary Intervention 6.9.2014  PROCEDURAL FINDINGS Hemodynamics: AO 131/59 mean 87 mm Hg LV 134/10 mm Hg              Coronary angiography: Coronary dominance: right  Left mainstem: Normal Left anterior descending (LAD): The LAD gives rise to a very large diagonal branch. There is a focal 80-90% mid LAD stenosis. The diagonal is without significant disease.   **The mid LAD was successfully stented using a 2.25 x 16 mm Promus Premier DES**  Left circumflex (LCx): Normal Right coronary artery (RCA): Diffuse 30-40% disease in the PLOM. 30% disease in the distal PCA. Left ventriculography: Left ventricular systolic function is normal, LVEF is estimated at 55-65%, there is no significant mitral regurgitation  _____________   History of Present Illness  79 y/o female without prior cardiac history.  She was in her usual state of health until the morning of admission when she awoke with midsternal chest heaviness without associated symptoms. Her son brought her to the [place] where ECG was without acute changes and troponin was found to be mildly elevated at 0.94. She was treated with aspirin and IV heparin and admitted for further evaluation and management of non-ST segment elevation myocardial infarction.  Hospital Course  Following admission, the patient had no further chest pain. Subsequent troponins  returned normal at less than 0.30. In addition to heparin, she was placed on aspirin, beta blocker, and hypodensity statin therapy. She underwent diagnostic catheterization which revealed significant mid LAD stenosis and otherwise nonobstructive disease and normal LV function. The LAD was successfully treated using a 2.25 by 16mm Promus Premier drug-eluting stent. She tolerated this procedure well and post procedure has been ambulatory without recurrent symptoms or limitations. She has been hypokalemic throughout her admission and has been supplemented. She will be discharged with supplemental potassium as well. We plan to discharge her this morning in good condition and have arranged for followup in our office in 7 days.  Discharge Vitals Blood pressure 150/51, pulse 85, temperature 97.7 F (36.5 C), temperature source Oral, resp. rate 18, height 5\' 3"  (1.6 m), weight 165 lb 5.5 oz (75 kg), SpO2 95.00%.  Filed Weights   09/29/12 1300 09/29/12 1718 10/01/12 0025  Weight: 165 lb (74.844 kg) 164 lb 14.5 oz (74.8 kg) 165 lb 5.5 oz (75 kg)    Labs  CBC  Recent Labs  09/30/12 0355 10/01/12 0605  WBC 6.3 6.6  HGB 12.8 12.7  HCT 38.3 38.2  MCV 80.5 80.8  PLT 191 178   Basic Metabolic Panel  Recent Labs  09/29/12 1020 09/29/12 1815 09/30/12 0355 10/01/12 0605  NA 140 141 140 143  K 3.3* 3.3* 3.3* 3.6  CL 102 105 104 108  CO2 29 30 27 27   GLUCOSE 106* 114* 95 94  BUN 24* 20 21 18   CREATININE 1.04 0.89 0.81 0.88  CALCIUM 9.1 9.3 9.1 8.8  MG  --  2.0  --   --  Liver Function Tests  Recent Labs  09/29/12 1020 09/29/12 1815  AST 22 21  ALT 18 16  ALKPHOS 55 48  BILITOT 0.7 0.6  PROT 6.9 6.1  ALBUMIN 3.6 3.2*    Recent Labs  09/29/12 1020  LIPASE 32   Cardiac Enzymes  Recent Labs  09/29/12 1831 09/29/12 2328 09/30/12 0355  TROPONINI <0.30 <0.30 <0.30   Fasting Lipid Panel  Recent Labs  09/30/12 0355  CHOL 183  HDL 63  LDLCALC 109*  TRIG 56  CHOLHDL  2.9   Thyroid Function Tests  Recent Labs  09/29/12 1815  TSH 1.350   Disposition  Pt is being discharged home today in good condition.  Follow-up Plans & Appointments      Follow-up Information   Follow up with Rick Duff, PA-C On 10/08/2012. (2:00 - Dr. Jenene Slicker PA)    Contact information:   [address] [place] [address] [phone]       Discharge Medications    Medication List    STOP taking these medications       verapamil 240 MG (CO) 24 hr tablet  Commonly known as:  COVERA HS      TAKE these medications       aspirin 81 MG EC tablet  Take 1 tablet (81 mg total) by mouth daily.     atorvastatin 80 MG tablet  Commonly known as:  LIPITOR  Take 1 tablet (80 mg total) by mouth daily at 6 PM.     CALCIUM + D PO  Take 1 tablet by mouth daily.     clopidogrel 75 MG tablet  Commonly known as:  PLAVIX  Take 1 tablet (75 mg total) by mouth daily with breakfast.     hydrochlorothiazide 25 MG tablet  Commonly known as:  HYDRODIURIL  Take 25 mg by mouth daily.     metoprolol tartrate 25 MG tablet  Commonly known as:  LOPRESSOR  Take 1 tablet (25 mg total) by mouth 2 (two) times daily.     multivitamin tablet  Take 1 tablet by mouth daily.     nitroGLYCERIN 0.4 MG SL tablet  Commonly known as:  NITROSTAT  Place 1 tablet (0.4 mg total) under the tongue every 5 (five) minutes x 3 doses as needed for chest pain.     potassium chloride SA 20 MEQ tablet  Commonly known as:  K-DUR,KLOR-CON  Take 2 tablets (40 mEq total) by mouth daily.      Outstanding Labs/Studies  F/u Lipids/lft's in 8 wks.  Duration of Discharge Encounter   Greater than 30 minutes including physician time.  Signed, Nicolasa Ducking NP 10/01/2012, 10:56 AM

## 2012-10-04 ENCOUNTER — Other Ambulatory Visit: Payer: Self-pay | Admitting: Emergency Medicine

## 2012-10-04 ENCOUNTER — Other Ambulatory Visit: Payer: Self-pay

## 2012-10-04 ENCOUNTER — Encounter: Payer: Self-pay | Admitting: Cardiology

## 2012-10-04 ENCOUNTER — Telehealth: Payer: Self-pay | Admitting: Cardiology

## 2012-10-04 ENCOUNTER — Ambulatory Visit (INDEPENDENT_AMBULATORY_CARE_PROVIDER_SITE_OTHER): Payer: Medicare Other | Admitting: Cardiology

## 2012-10-04 ENCOUNTER — Ambulatory Visit (INDEPENDENT_AMBULATORY_CARE_PROVIDER_SITE_OTHER)
Admission: RE | Admit: 2012-10-04 | Discharge: 2012-10-04 | Disposition: A | Discharge status: Home / Self Care | Payer: Medicare Other | Source: Ambulatory Visit | Attending: Cardiology | Admitting: Cardiology

## 2012-10-04 VITALS — BP 122/74 | HR 63 | Ht 63.0 in | Wt 165.0 lb

## 2012-10-04 DIAGNOSIS — R0789 Other chest pain: Secondary | ICD-10-CM

## 2012-10-04 DIAGNOSIS — R791 Abnormal coagulation profile: Secondary | ICD-10-CM

## 2012-10-04 DIAGNOSIS — I10 Essential (primary) hypertension: Secondary | ICD-10-CM

## 2012-10-04 DIAGNOSIS — R7989 Other specified abnormal findings of blood chemistry: Secondary | ICD-10-CM

## 2012-10-04 DIAGNOSIS — E876 Hypokalemia: Secondary | ICD-10-CM

## 2012-10-04 DIAGNOSIS — I2510 Atherosclerotic heart disease of native coronary artery without angina pectoris: Secondary | ICD-10-CM

## 2012-10-04 DIAGNOSIS — R079 Chest pain, unspecified: Secondary | ICD-10-CM

## 2012-10-04 LAB — BASIC METABOLIC PANEL
BUN: 19 mg/dL (ref 6–23)
CO2: 22 mEq/L (ref 19–32)
Chloride: 107 mEq/L (ref 96–112)
Creatinine, Ser: 0.9 mg/dL (ref 0.4–1.2)
Glucose, Bld: 103 mg/dL — ABNORMAL HIGH (ref 70–99)
Potassium: 4.2 mEq/L (ref 3.5–5.1)

## 2012-10-04 LAB — CBC WITH DIFFERENTIAL/PLATELET
Basophils Relative: 0.4 % (ref 0.0–3.0)
Eosinophils Absolute: 0.1 10*3/uL (ref 0.0–0.7)
Eosinophils Relative: 1.4 % (ref 0.0–5.0)
Hemoglobin: 14 g/dL (ref 12.0–15.0)
Lymphocytes Relative: 19.6 % (ref 12.0–46.0)
MCHC: 33 g/dL (ref 30.0–36.0)
Monocytes Relative: 5.4 % (ref 3.0–12.0)
Neutro Abs: 5 10*3/uL (ref 1.4–7.7)
Neutrophils Relative %: 73.2 % (ref 43.0–77.0)
RBC: 5.16 Mil/uL — ABNORMAL HIGH (ref 3.87–5.11)
WBC: 6.8 10*3/uL (ref 4.5–10.5)

## 2012-10-04 LAB — D-DIMER, QUANTITATIVE: D-Dimer, Quant: 0.89 ug/mL-FEU — ABNORMAL HIGH (ref 0.00–0.48)

## 2012-10-04 MED ORDER — IOHEXOL 350 MG/ML SOLN
80.0000 mL | Freq: Once | INTRAVENOUS | Status: AC | PRN
  Administered 2012-10-04: 80 mL via INTRAVENOUS

## 2012-10-04 NOTE — Telephone Encounter (Addendum)
New Prob   Pt was discharged from hospital Tuesday. Currently has a follow up scheduled for 6/17. Pt son states pt is having some SOB and would like to speak to nurse regarding this.  Son called ans svc before 8 am, wrong number used, delay in contacting patient. Pt had SOB and some mild chest pain yesterday and today. It is 3/10. No change with deep inspiration or movement. Resolved during the day (?after she took morning meds?). She believes it may be anxiety but wants to see MD. Ofc has called back, she will be seen today, believe this is best option; definitely the one patient prefers.

## 2012-10-04 NOTE — Telephone Encounter (Signed)
Returned call to patient's son he stated mother is complaining of chest pain,sob.Spoke to patient she stated her breathing feels heavy.States feels like it is anxiety.States she is having a # 1 to 2 chest pain.Appointment scheduled to be seen today with Dr.Jordan.

## 2012-10-04 NOTE — Progress Notes (Signed)
Nancy Wise Date of Birth: [birth year] Medical Record #[id number]  History of Present Illness: Nancy Wise is seen as a work in today. She was recently hospitalized with a non-ST elevation myocardial infarction. She was found to have a high-grade stenosis in the mid LAD which was successfully stented with a DES. Her hospital course was uncomplicated. She was switched from verapamil to metoprolol. Her potassium was low and this was repleted. Over the last 2 days she states she just hasn't felt well. She actually drained that she couldn't get her breath. She really denies being short of breath during the day. She has had some mild chest discomfort in left precordial area is described as an ache. This is different than her presenting chest pain. She has similar symptoms yesterday morning that seemed to get better during the day. She's had no edema. There is no pleuritic pain. She does feel anxious.  Current Outpatient Prescriptions on File Prior to Visit  Medication Sig Dispense Refill  . aspirin EC 81 MG EC tablet Take 1 tablet (81 mg total) by mouth daily.      [name] atorvastatin (LIPITOR) 80 MG tablet Take 1 tablet (80 mg total) by mouth daily at 6 PM.  30 tablet  6  . Calcium Carbonate-Vitamin D (CALCIUM + D PO) Take 1 tablet by mouth daily.      . clopidogrel (PLAVIX) 75 MG tablet Take 1 tablet (75 mg total) by mouth daily with breakfast.  30 tablet  6  . hydrochlorothiazide (HYDRODIURIL) 25 MG tablet Take 25 mg by mouth daily.      . metoprolol tartrate (LOPRESSOR) 25 MG tablet Take 1 tablet (25 mg total) by mouth 2 (two) times daily.  60 tablet  6  . Multiple Vitamin (MULTIVITAMIN) tablet Take 1 tablet by mouth daily.      . nitroGLYCERIN (NITROSTAT) 0.4 MG SL tablet Place 1 tablet (0.4 mg total) under the tongue every 5 (five) minutes x 3 doses as needed for chest pain.  25 tablet  3  . potassium chloride SA (K-DUR,KLOR-CON) 20 MEQ tablet Take 2 tablets (40 mEq total) by mouth daily.   30 tablet  6   No current facility-administered medications on file prior to visit.    No Known Allergies  Past Medical History  Diagnosis Date  . Hypertension   . Bladder retention   . Melanoma of foot   . Basal cell carcinoma of nose   . Coronary artery disease     a. 09/2012 NSTEMI/Cath/PCI: LM nl, LAD 80-58m (2.25x16 Promus Premier DES), LCX nl, RCA 30-40 diff in PL, 30 PDA, EF 55-60%.    Past Surgical History  Procedure Laterality Date  . Tonsillectomy  1930's  . Cesarean section  1968  . Coronary angioplasty with stent placement  09/30/2012    "1" (09/30/2012)  . Melanoma excision Left 2013    'foot" (09/30/2012)  . Basal cell carcinoma excision Right 2011?    "nose" (09/30/2012)    History  Smoking status  . Never Smoker   Smokeless tobacco  . Never Used    History  Alcohol Use No    Family History  Problem Relation Age of Onset  . Cancer Sister 63    Lung    Review of Systems: As noted in history of present illness.  All other systems were reviewed and are negative.  Physical Exam: BP 122/74  Pulse 63  Ht 5\' 3"  (1.6 m)  Wt 165 lb (74.844 kg)  BMI 29.24 kg/m2 She is a pleasant elderly white female in no acute distress. HEENT: Normal. No JVD, adenopathy, thyromegaly, or bruits. Lungs: Clear Cardiovascular: Regular rate and rhythm. Normal S1 and S2. No gallop, murmur, or click. Abdomen: Soft and nontender. No masses or bruits. Extremities: Pedal pulses are good. Her radial site is healing well with some old bruising but no hematoma. She has no evidence of edema or phlebitis. Neuro: Alert and oriented x3. Cranial nerves II through XII are intact. Skin: Warm and dry.  LABORATORY DATA: ECG today demonstrates normal sinus rhythm with occasional PACs. There is low voltage. Otherwise no acute ST or T wave changes.  Assessment / Plan: 1. Coronary disease status post recent non-ST elevation myocardial infarction. Status post stenting of the mid LAD with a  DES. 2. Atypical chest pain. Her symptoms today are quite different than her presentation with her acute coronary syndrome. She has no edema or evidence of phlebitis. I recommended that she try Pepcid over-the-counter for her chest discomfort. We will check lab work today including a CBC, a basic metabolic panel, d-dimer, and troponin. Have reassured her concerning her findings today we will plan on seeing her back in 2 weeks. 3. Hypertension-controlled 4. Hypokalemia. Currently taking potassium 40 mEq daily. Will check potassium level today.

## 2012-10-04 NOTE — Addendum Note (Signed)
Addended by: Ayrianna Mcginniss, [place] N on: 10/04/2012 11:21 AM   Modules accepted: Orders

## 2012-10-04 NOTE — Patient Instructions (Addendum)
We will check lab work on you today.  Continue your current medication.  We will see you in 2 weeks.  You may try Pepcid OTC as needed for chest pain.

## 2012-10-08 ENCOUNTER — Encounter: Payer: Medicare Other | Admitting: Cardiology

## 2012-10-16 ENCOUNTER — Emergency Department (HOSPITAL_COMMUNITY)
Admission: EM | Admit: 2012-10-16 | Discharge: 2012-10-16 | Disposition: A | Discharge status: Home / Self Care | Payer: Medicare Other | Attending: Emergency Medicine | Admitting: Emergency Medicine

## 2012-10-16 ENCOUNTER — Telehealth: Payer: Self-pay | Admitting: Cardiology

## 2012-10-16 DIAGNOSIS — Z9861 Coronary angioplasty status: Secondary | ICD-10-CM | POA: Insufficient documentation

## 2012-10-16 DIAGNOSIS — R079 Chest pain, unspecified: Secondary | ICD-10-CM

## 2012-10-16 DIAGNOSIS — R072 Precordial pain: Secondary | ICD-10-CM | POA: Insufficient documentation

## 2012-10-16 DIAGNOSIS — R209 Unspecified disturbances of skin sensation: Secondary | ICD-10-CM | POA: Insufficient documentation

## 2012-10-16 DIAGNOSIS — Z79899 Other long term (current) drug therapy: Secondary | ICD-10-CM | POA: Insufficient documentation

## 2012-10-16 DIAGNOSIS — Z85828 Personal history of other malignant neoplasm of skin: Secondary | ICD-10-CM | POA: Insufficient documentation

## 2012-10-16 DIAGNOSIS — I10 Essential (primary) hypertension: Secondary | ICD-10-CM | POA: Insufficient documentation

## 2012-10-16 DIAGNOSIS — Z7982 Long term (current) use of aspirin: Secondary | ICD-10-CM | POA: Insufficient documentation

## 2012-10-16 DIAGNOSIS — I2510 Atherosclerotic heart disease of native coronary artery without angina pectoris: Secondary | ICD-10-CM | POA: Insufficient documentation

## 2012-10-16 LAB — BASIC METABOLIC PANEL
BUN: 15 mg/dL (ref 6–23)
Chloride: 103 mEq/L (ref 96–112)
Creatinine, Ser: 0.89 mg/dL (ref 0.50–1.10)
GFR calc non Af Amer: 59 mL/min — ABNORMAL LOW (ref >90)
Glucose, Bld: 108 mg/dL — ABNORMAL HIGH (ref 70–99)
Potassium: 3.9 mEq/L (ref 3.5–5.1)

## 2012-10-16 LAB — CBC
HCT: 40.3 % (ref 36.0–46.0)
Hemoglobin: 13.6 g/dL (ref 12.0–15.0)
MCHC: 33.7 g/dL (ref 30.0–36.0)
MCV: 80.4 fL (ref 78.0–100.0)

## 2012-10-16 NOTE — ED Notes (Signed)
Per pt chest pain that started today  But denies any other associated symptoms. sts left sided chest pain.Nancy Wise

## 2012-10-16 NOTE — Telephone Encounter (Signed)
Returned call to patient she stated she woke up with chest pain this morning.Stated she is having a heaviness in left chest with pain radiating down left arm.No Sob.Spoke with DOD Dr.Katz he advised to go to [facility] ER.Mardella Layman called and told patient going to ER.

## 2012-10-16 NOTE — ED Provider Notes (Signed)
I saw and evaluated the patient, reviewed the resident's note and I agree with the findings and plan.  Patient presents with chest pain less than 3 weeks after her cardiac catheterization that revealed LAD lesion which was stented. Patient's pain resolved while she was in the waiting room waiting to come back to the department. Her EKG and troponin were negative. It was recommended that the patient stay in the hospital, but she declines. Case was therefore discussed with her cardiologist and he will see her in the office. Patient was counseled that she needs to return to the ER immediately if she has any recurrence of pain and ready to be admitted at that time. She expresses understanding and agreement.  Gilda Crease, MD 10/16/12 [phone]

## 2012-10-16 NOTE — ED Provider Notes (Signed)
History    CSN: [id number] Arrival date & time 10/16/12  1314  First MD Initiated Contact with Patient 10/16/12 1544     Chief Complaint  Patient presents with  . Chest Pain   (Consider location/radiation/quality/duration/timing/severity/associated sxs/prior Treatment) Patient is a 79 y.o. female presenting with chest pain.  Chest Pain Pain location:  Substernal area Pain quality: pressure   Pain radiates to:  Does not radiate Pain radiates to the back: no   Pain severity:  Mild Onset quality: on awakening. Duration:  6 hours Timing:  Constant Progression:  Resolved Chronicity:  Recurrent Context: at rest   Context: not breathing and not lifting   Relieved by:  Nothing Exacerbated by: nothing. Ineffective treatments:  None tried Associated symptoms: numbness (L arm and leg, x2 months, intermittent)   Associated symptoms: no abdominal pain, no back pain, no cough, no dizziness, no dysphagia, no fever, no nausea, no orthopnea, no shortness of breath and not vomiting   Risk factors: coronary artery disease    Past Medical History  Diagnosis Date  . Hypertension   . Bladder retention   . Melanoma of foot   . Basal cell carcinoma of nose   . Coronary artery disease     a. 09/2012 NSTEMI/Cath/PCI: LM nl, LAD 80-33m (2.25x16 Promus Premier DES), LCX nl, RCA 30-40 diff in PL, 30 PDA, EF 55-60%.   Past Surgical History  Procedure Laterality Date  . Tonsillectomy  1930's  . Cesarean section  1968  . Coronary angioplasty with stent placement  09/30/2012    "1" (09/30/2012)  . Melanoma excision Left 2013    'foot" (09/30/2012)  . Basal cell carcinoma excision Right 2011?    "nose" (09/30/2012)   Family History  Problem Relation Age of Onset  . Cancer Sister 52    Lung   History  Substance Use Topics  . Smoking status: Never Smoker   . Smokeless tobacco: Never Used  . Alcohol Use: No   OB History   Grav Para Term Preterm Abortions TAB SAB Ect Mult Living                  Review of Systems  Constitutional: Negative for fever and chills.  HENT: Negative for congestion, sore throat, rhinorrhea and trouble swallowing.   Eyes: Negative for photophobia and visual disturbance.  Respiratory: Negative for cough and shortness of breath.   Cardiovascular: Positive for chest pain. Negative for orthopnea and leg swelling.  Gastrointestinal: Negative for nausea, vomiting, abdominal pain, diarrhea and constipation.  Endocrine: Negative for polyphagia and polyuria.  Genitourinary: Negative for dysuria, flank pain, vaginal bleeding, vaginal discharge and enuresis.  Musculoskeletal: Negative for back pain and gait problem.  Skin: Negative for color change and rash.  Neurological: Positive for numbness (L arm and leg, x2 months, intermittent). Negative for dizziness, syncope and light-headedness.  Hematological: Negative for adenopathy. Does not bruise/bleed easily.  All other systems reviewed and are negative.    Allergies  Review of patient's allergies indicates no known allergies.  Home Medications   Current Outpatient Rx  Name  Route  Sig  Dispense  Refill  . aspirin EC 81 MG EC tablet   Oral   Take 1 tablet (81 mg total) by mouth daily.         [name] atorvastatin (LIPITOR) 80 MG tablet   Oral   Take 1 tablet (80 mg total) by mouth daily at 6 PM.   30 tablet   6   .  Calcium Carbonate-Vitamin D (CALCIUM + D PO)   Oral   Take 1 tablet by mouth daily.         . clopidogrel (PLAVIX) 75 MG tablet   Oral   Take 1 tablet (75 mg total) by mouth daily with breakfast.   30 tablet   6   . hydrochlorothiazide (HYDRODIURIL) 25 MG tablet   Oral   Take 25 mg by mouth daily.         . metoprolol tartrate (LOPRESSOR) 25 MG tablet   Oral   Take 1 tablet (25 mg total) by mouth 2 (two) times daily.   60 tablet   6   . Multiple Vitamin (MULTIVITAMIN) tablet   Oral   Take 1 tablet by mouth daily.         . nitroGLYCERIN (NITROSTAT) 0.4 MG SL tablet    Sublingual   Place 1 tablet (0.4 mg total) under the tongue every 5 (five) minutes x 3 doses as needed for chest pain.   25 tablet   3   . potassium chloride SA (K-DUR,KLOR-CON) 20 MEQ tablet   Oral   Take 2 tablets (40 mEq total) by mouth daily.   30 tablet   6    BP 117/46  Pulse 65  Temp(Src) 97.8 F (36.6 C)  Resp 18  SpO2 95% Physical Exam  Vitals reviewed. Constitutional: She is oriented to person, place, and time. She appears well-developed and well-nourished.  HENT:  Head: Normocephalic and atraumatic.  Right Ear: External ear normal.  Left Ear: External ear normal.  Eyes: Conjunctivae and EOM are normal. Pupils are equal, round, and reactive to light.  Neck: Normal range of motion. Neck supple.  Cardiovascular: Normal rate, regular rhythm, normal heart sounds and intact distal pulses.   Pulmonary/Chest: Effort normal and breath sounds normal.  Abdominal: Soft. Bowel sounds are normal. There is no tenderness.  Musculoskeletal: Normal range of motion.  Neurological: She is alert and oriented to person, place, and time.  Skin: Skin is warm and dry.    ED Course  Procedures (including critical care time) Labs Reviewed  BASIC METABOLIC PANEL - Abnormal; Notable for the following:    Glucose, Bld 108 (*)    GFR calc non Af Amer 59 (*)    GFR calc Af Amer 69 (*)    All other components within normal limits  CBC  POCT I-STAT TROPONIN I   Results for orders placed during the hospital encounter of 10/16/12  CBC      Result Value Range   WBC 7.2  4.0 - 10.5 K/uL   RBC 5.01  3.87 - 5.11 MIL/uL   Hemoglobin 13.6  12.0 - 15.0 g/dL   HCT 45.4  09.8 - 11.9 %   MCV 80.4  78.0 - 100.0 fL   MCH 27.1  26.0 - 34.0 pg   MCHC 33.7  30.0 - 36.0 g/dL   RDW 14.7  82.9 - 56.2 %   Platelets 245  150 - 400 K/uL  BASIC METABOLIC PANEL      Result Value Range   Sodium 141  135 - 145 mEq/L   Potassium 3.9  3.5 - 5.1 mEq/L   Chloride 103  96 - 112 mEq/L   CO2 30  19 - 32 mEq/L    Glucose, Bld 108 (*) 70 - 99 mg/dL   BUN 15  6 - 23 mg/dL   Creatinine, Ser 1.30  0.50 - 1.10 mg/dL   Calcium 9.5  8.4 - 10.5 mg/dL   GFR calc non Af Amer 59 (*) >90 mL/min   GFR calc Af Amer 69 (*) >90 mL/min  POCT I-STAT TROPONIN I      Result Value Range   Troponin i, poc 0.01  0.00 - 0.08 ng/mL   Comment 3             No results found. 1. Chest pain     Date: 10/16/2012  Rate: 67  Rhythm: sinus arrhythmia  QRS Axis: right  Intervals: normal  ST/T Wave abnormalities: nonspecific T wave changes  Conduction Disutrbances:none  Narrative Interpretation: Sinus arrhythmia  Old EKG Reviewed: unchanged    MDM  79 y.o. female  with pertinent PMH of CAD sp stenting 2 weeks ago presents with chest pain similar to prior MI, mild pressure 2/10, now relieved, no exacerbating/alleviating factors.  Physical exam benign.  ECG as above.  Initial labs unremarkable.  Spoke with pt at length about course of admission for monitoring given recent stenting and nature of symptoms, however she stated that she would not accept admission.  Spoke with cardiology who will arrange for outpt fu.  Pt given very strict return precautions for any return of pain or symptoms, she voiced understanding and agreed to fu.  Feel she is of sound mind, repeated and understood risk.    Labs and imaging as above reviewed by myself and attending,Dr. Blinda Leatherwood, with whom case was discussed.   1. Chest pain       Noel Gerold, MD 10/16/12 [phone]

## 2012-10-16 NOTE — Telephone Encounter (Signed)
New problem   Pt is not feeling well and wants to know if she can be seen today

## 2012-10-21 ENCOUNTER — Encounter: Payer: Self-pay | Admitting: Nurse Practitioner

## 2012-10-21 ENCOUNTER — Ambulatory Visit (INDEPENDENT_AMBULATORY_CARE_PROVIDER_SITE_OTHER): Payer: Medicare Other | Admitting: Nurse Practitioner

## 2012-10-21 ENCOUNTER — Other Ambulatory Visit: Payer: Self-pay | Admitting: *Deleted

## 2012-10-21 VITALS — BP 110/72 | HR 52 | Ht 63.0 in | Wt 164.4 lb

## 2012-10-21 DIAGNOSIS — I2510 Atherosclerotic heart disease of native coronary artery without angina pectoris: Secondary | ICD-10-CM

## 2012-10-21 MED ORDER — FAMOTIDINE 20 MG PO TABS: 20.0000 mg | ORAL_TABLET | Freq: Two times a day (BID) | ORAL | Status: AC | PRN

## 2012-10-21 NOTE — Progress Notes (Signed)
Nancy Wise Date of Birth: [birth year] Medical Record #[id number]  History of Present Illness: Nancy Wise is seen back today for a post hospital visit. Seen for Dr. Antoine Poche. Has CAD, HTN and hypokalemia. She is obese.   Had recent NSTEMI earlier this month with stenting of the LAD with a Promus DES. EF was normal. She has already been seen back in the office 2 weeks ago with chest pain - felt to be atypical. Called last week with chest pain and referred to the ER. That evaluation was negative as well. She was offered overnight admission but she was feeling better and declined.   Comes in today. She is here alone. She has not had any more chest pain. She thinks the Plavix is giving her indigestion. Has some store brand Pepcid and would like to take. Not very active. BP is ok. Not short of breath. Would like to attend cardiac rehab. Wanting to travel to [place] later this month for her twin sons' 50th birthday.   Current Outpatient Prescriptions  Medication Sig Dispense Refill  . aspirin EC 81 MG EC tablet Take 1 tablet (81 mg total) by mouth daily.      [name] atorvastatin (LIPITOR) 80 MG tablet Take 1 tablet (80 mg total) by mouth daily at 6 PM.  30 tablet  6  . Calcium Carbonate-Vitamin D (CALCIUM + D PO) Take 1 tablet by mouth daily.      . clopidogrel (PLAVIX) 75 MG tablet Take 1 tablet (75 mg total) by mouth daily with breakfast.  30 tablet  6  . hydrochlorothiazide (HYDRODIURIL) 25 MG tablet Take 25 mg by mouth daily.      . metoprolol tartrate (LOPRESSOR) 25 MG tablet Take 1 tablet (25 mg total) by mouth 2 (two) times daily.  60 tablet  6  . Multiple Vitamin (MULTIVITAMIN) tablet Take 1 tablet by mouth daily.      . nitroGLYCERIN (NITROSTAT) 0.4 MG SL tablet Place 1 tablet (0.4 mg total) under the tongue every 5 (five) minutes x 3 doses as needed for chest pain.  25 tablet  3  . famotidine (PEPCID) 20 MG tablet Take 1 tablet (20 mg total) by mouth 2 (two) times daily as needed for heartburn.        . potassium chloride SA (K-DUR,KLOR-CON) 20 MEQ tablet Take 2 tablets (40 mEq total) by mouth daily.  30 tablet  6   No current facility-administered medications for this visit.    No Known Allergies  Past Medical History  Diagnosis Date  . Hypertension   . Bladder retention   . Melanoma of foot   . Basal cell carcinoma of nose   . Coronary artery disease     a. 09/2012 NSTEMI/Cath/PCI: LM nl, LAD 80-73m (2.25x16 Promus Premier DES), LCX nl, RCA 30-40 diff in PL, 30 PDA, EF 55-60%.  . Obesity     Past Surgical History  Procedure Laterality Date  . Tonsillectomy  1930's  . Cesarean section  1968  . Coronary angioplasty with stent placement  09/30/2012    "1" (09/30/2012)  . Melanoma excision Left 2013    'foot" (09/30/2012)  . Basal cell carcinoma excision Right 2011?    "nose" (09/30/2012)    History  Smoking status  . Never Smoker   Smokeless tobacco  . Never Used    History  Alcohol Use No    Family History  Problem Relation Age of Onset  . Cancer Sister 8    Lung  Review of Systems: The review of systems is per the HPI.  All other systems were reviewed and are negative.  Physical Exam: BP 110/72  Pulse 52  Ht 5\' 3"  (1.6 m)  Wt 164 lb 6.4 oz (74.571 kg)  BMI 29.13 kg/m2 Patient is alert and in no acute distress. Her affect seems pretty flat to me. She is obese. Skin is warm and dry. Color is normal.  HEENT is unremarkable. Normocephalic/atraumatic. PERRL. Sclera are nonicteric. Neck is supple. No masses. No JVD. Lungs are clear. Cardiac exam shows a regular rate and rhythm. Abdomen is soft. Extremities are without edema. Gait and ROM are intact. No gross neurologic deficits noted.  LABORATORY DATA:  Lab Results  Component Value Date   WBC 7.2 10/16/2012   HGB 13.6 10/16/2012   HCT 40.3 10/16/2012   PLT 245 10/16/2012   GLUCOSE 108* 10/16/2012   CHOL 183 09/30/2012   TRIG 56 09/30/2012   HDL 63 09/30/2012   LDLCALC 161* 09/30/2012   ALT 16 09/29/2012   AST 21  09/29/2012   NA 141 10/16/2012   K 3.9 10/16/2012   CL 103 10/16/2012   CREATININE 0.89 10/16/2012   BUN 15 10/16/2012   CO2 30 10/16/2012   TSH 1.350 09/29/2012   INR 1.01 09/29/2012   Coronary angiography:   Left mainstem: Normal  Left anterior descending (LAD): The LAD gives rise to a very large diagonal branch. There is a focal 80-90% mid LAD stenosis. The diagonal is without significant disease.  Left circumflex (LCx): Normal  Right coronary artery (RCA): Diffuse 30-40% disease in the PLOM. 30% disease in the distal PCA.  Left ventriculography: Left ventricular systolic function is normal, LVEF is estimated at 55-65%, there is no significant mitral regurgitation   PCI Note: Following the diagnostic procedure, the decision was made to proceed with PCI. The patient was enrolled in the Regulate trial and was randomized to the Bivalirudin arm. Once a therapeutic ACT was achieved, a 6 [place] XBLAD 3.5 guide catheter was inserted. A prowater coronary guidewire was used to cross the lesion. The lesion was predilated with a 2.0 mm balloon. The lesion was then stented with a 2.25 x 16 mm Promus premier stent. Following PCI, there was 0% residual stenosis and TIMI-3 flow. Final angiography confirmed an excellent result. The patient tolerated the procedure well. There were no immediate procedural complications. A TR band was used for radial hemostasis. The patient was transferred to the post catheterization recovery area for further monitoring.   PCI Data:  Vessel - LAD/Segment - mid  Percent Stenosis (pre) 80-90%  TIMI-flow 3  Stent 2.25 x 16 mm Promus premier  Percent Stenosis (post) 0%  TIMI-flow (post) 3   Final Conclusions:  1. Single vessel obstructive CAD  2. Normal LV function  3. Successful stenting of the mid LAD with DES.  Recommendations:  Dual antiplatelet therapy for one year. Anticipate DC in am.  Theron Arista [facility]  09/30/2012, 10:09 AM  Assessment / Plan: 1. Recent NSTEMI - s/p  DES to the LAD - on aspirin and Plavix. Doing ok. Has had recurrent chest pain twice - once requiring visit here and once in the ER. She feels like it is more indigestion-like. I will add Pepcid twice a day. She is wanting to travel to [place] later in July. I think that is ok as long as she is doing well and without symptoms. I will see her back before her travel date.   2. HTN -  blood pressure is ok.   3. HLD - on statin therapy.   Patient is agreeable to this plan and will call if any problems develop in the interim.   Rosalio Macadamia, RN, ANP-C [facility] [address] [place], [place]  [address]

## 2012-10-21 NOTE — Patient Instructions (Signed)
I would like to see you on July 18th.  I think that as long as you are doing well and not having symptoms that you will be able to travel to [place]  Stay on your current medicines but add the store brand Pepcid to take two times a day. This will hopefully let you tolerate the Plavix better  Try to increase your activity and start walking some  Call the [facility] office at [phone] if you have any questions, problems or concerns.

## 2012-11-08 ENCOUNTER — Telehealth: Payer: Self-pay | Admitting: *Deleted

## 2012-11-08 ENCOUNTER — Ambulatory Visit (INDEPENDENT_AMBULATORY_CARE_PROVIDER_SITE_OTHER): Payer: Medicare Other | Admitting: Nurse Practitioner

## 2012-11-08 ENCOUNTER — Encounter: Payer: Self-pay | Admitting: Nurse Practitioner

## 2012-11-08 ENCOUNTER — Encounter: Payer: Self-pay | Admitting: *Deleted

## 2012-11-08 VITALS — BP 110/68 | HR 60 | Ht 63.0 in | Wt 161.1 lb

## 2012-11-08 DIAGNOSIS — I2510 Atherosclerotic heart disease of native coronary artery without angina pectoris: Secondary | ICD-10-CM

## 2012-11-08 NOTE — Patient Instructions (Addendum)
I think you are doing well  Stay on your current medicines  I will see you in 3 months  Call the  [facility] office at [phone] if you have any questions, problems or concerns.

## 2012-11-08 NOTE — Progress Notes (Signed)
Linus Salmons Date of Birth: [birth year] Medical Record #[id number]  History of Present Illness: Ms. Milson is seen back today for a 3 week check. Seen for Dr. [place]. She has Has CAD with recent NSTEMI in June 2013 with stenting of the LAD with a Promus DES. EF was normal., HTN and hypokalemia. She is obese.   Had already been seen back in the office with chest pain - felt to be atypical. Called 3 weeks ago with chest pain and referred to the ER. That evaluation was negative as well. She was offered overnight admission but she was feeling better and declined.   I saw her 3 weeks ago - seemed like the Plavix was causing indigestion. We added Pepcid.   Comes back today. She is here alone. Doing better. Getting ready to travel to [place] for her twin sons' 50th birthday party. She has had no more chest pain. Feels ok. Not very active. Has not heard from cardiac rehab. Dr. Chilton Si has cut her potassium to just one pill a day. Has labs planned with him in September. Overall she is doing ok.     Current Outpatient Prescriptions  Medication Sig Dispense Refill  . aspirin EC 81 MG EC tablet Take 1 tablet (81 mg total) by mouth daily.      [name] atorvastatin (LIPITOR) 80 MG tablet Take 1 tablet (80 mg total) by mouth daily at 6 PM.  30 tablet  6  . Calcium Carbonate-Vitamin D (CALCIUM + D PO) Take 1 tablet by mouth daily.      . clopidogrel (PLAVIX) 75 MG tablet Take 1 tablet (75 mg total) by mouth daily with breakfast.  30 tablet  6  . famotidine (PEPCID) 20 MG tablet Take 1 tablet (20 mg total) by mouth 2 (two) times daily as needed for heartburn.      . hydrochlorothiazide (HYDRODIURIL) 25 MG tablet Take 25 mg by mouth daily.      . metoprolol tartrate (LOPRESSOR) 25 MG tablet Take 1 tablet (25 mg total) by mouth 2 (two) times daily.  60 tablet  6  . Multiple Vitamin (MULTIVITAMIN) tablet Take 1 tablet by mouth daily.      . nitroGLYCERIN (NITROSTAT) 0.4 MG SL tablet Place 1 tablet (0.4 mg total)  under the tongue every 5 (five) minutes x 3 doses as needed for chest pain.  25 tablet  3  . potassium chloride SA (K-DUR,KLOR-CON) 20 MEQ tablet Take 2 tablets (40 mEq total) by mouth daily.  30 tablet  6   No current facility-administered medications for this visit.    No Known Allergies  Past Medical History  Diagnosis Date  . Hypertension   . Bladder retention   . Melanoma of foot   . Basal cell carcinoma of nose   . Coronary artery disease     a. 09/2012 NSTEMI/Cath/PCI: LM nl, LAD 80-21m (2.25x16 Promus Premier DES), LCX nl, RCA 30-40 diff in PL, 30 PDA, EF 55-60%.  . Obesity     Past Surgical History  Procedure Laterality Date  . Tonsillectomy  1930's  . Cesarean section  1968  . Coronary angioplasty with stent placement  09/30/2012    "1" (09/30/2012)  . Melanoma excision Left 2013    'foot" (09/30/2012)  . Basal cell carcinoma excision Right 2011?    "nose" (09/30/2012)    History  Smoking status  . Never Smoker   Smokeless tobacco  . Never Used    History  Alcohol Use No  Family History  Problem Relation Age of Onset  . Cancer Sister 39    Lung    Review of Systems: The review of systems is per the HPI.  All other systems were reviewed and are negative.  Physical Exam: BP 110/68  Pulse 60  Ht 5\' 3"  (1.6 m)  Wt 161 lb 1.9 oz (73.084 kg)  BMI 28.55 kg/m2 Patient is very pleasant and in no acute distress. Her affect is a little flat. Skin is warm and dry. Color is normal.  HEENT is unremarkable. Normocephalic/atraumatic. PERRL. Sclera are nonicteric. Neck is supple. No masses. No JVD. Lungs are clear. Cardiac exam shows a regular rate and rhythm. Abdomen is soft. Extremities are without edema. Gait and ROM are intact. No gross neurologic deficits noted.   LABORATORY DATA: Lab Results  Component Value Date   WBC 7.2 10/16/2012   HGB 13.6 10/16/2012   HCT 40.3 10/16/2012   PLT 245 10/16/2012   GLUCOSE 108* 10/16/2012   CHOL 183 09/30/2012   TRIG 56 09/30/2012    HDL 63 09/30/2012   LDLCALC 161* 09/30/2012   ALT 16 09/29/2012   AST 21 09/29/2012   NA 141 10/16/2012   K 3.9 10/16/2012   CL 103 10/16/2012   CREATININE 0.89 10/16/2012   BUN 15 10/16/2012   CO2 30 10/16/2012   TSH 1.350 09/29/2012   INR 1.01 09/29/2012     Assessment / Plan: 1. CAD - with NSTEMI and DES to the LAD in June - EF is normal - doing well. No more symptoms. I think it is ok for her to travel. See her back in 3 months. Will send another note thru [facility] to rehab.   2. HTN - BP looks good.   3. HLD - on statin therapy.   Will see her back in about 3 months. She will be having labs by Dr. Chilton Si in September.   Patient is agreeable to this plan and will call if any problems develop in the interim.   Rosalio Macadamia, RN, ANP-C  [facility] [address] [place], [place]  [address]

## 2012-11-08 NOTE — Telephone Encounter (Signed)
Mailed ltr to pt today to give date and time of pt's 3 month ov

## 2012-11-28 ENCOUNTER — Encounter (HOSPITAL_COMMUNITY)
Admission: RE | Admit: 2012-11-28 | Discharge: 2012-11-28 | Disposition: A | Discharge status: Home / Self Care | Payer: Medicare Other | Source: Ambulatory Visit | Attending: Cardiology | Admitting: Cardiology

## 2012-11-28 DIAGNOSIS — Z5189 Encounter for other specified aftercare: Secondary | ICD-10-CM | POA: Insufficient documentation

## 2012-11-28 DIAGNOSIS — I214 Non-ST elevation (NSTEMI) myocardial infarction: Secondary | ICD-10-CM | POA: Insufficient documentation

## 2012-11-28 DIAGNOSIS — E785 Hyperlipidemia, unspecified: Secondary | ICD-10-CM | POA: Insufficient documentation

## 2012-11-28 DIAGNOSIS — I2510 Atherosclerotic heart disease of native coronary artery without angina pectoris: Secondary | ICD-10-CM | POA: Insufficient documentation

## 2012-11-28 DIAGNOSIS — I10 Essential (primary) hypertension: Secondary | ICD-10-CM | POA: Insufficient documentation

## 2012-11-28 DIAGNOSIS — Z79899 Other long term (current) drug therapy: Secondary | ICD-10-CM | POA: Insufficient documentation

## 2012-11-28 NOTE — Progress Notes (Signed)
Cardiac Rehab Medication Review by a Pharmacist  Does the patient  feel that his/her medications are working for him/her?  yes  Has the patient been experiencing any side effects to the medications prescribed?  no  Does the patient measure his/her own blood pressure or blood glucose at home?  no   Does the patient have any problems obtaining medications due to transportation or finances?   no  Understanding of regimen: good Understanding of indications: good Potential of compliance: good    Pharmacist comments: Nancy Wise is a pleasant 79 yo female in for cardiac rehab. She reports taking her Pepcid prn pretty regularly but only to "prevent" heartburn.  She reports no side effects from her meds and has no questions.   Shelba Flake Achilles Dunk, PharmD Clinical Pharmacist - Resident Pager: [phone] Pharmacy: [phone] 11/28/2012 8:46 AM

## 2012-12-02 ENCOUNTER — Encounter (HOSPITAL_COMMUNITY)
Admission: RE | Admit: 2012-12-02 | Discharge: 2012-12-02 | Disposition: A | Discharge status: Home / Self Care | Payer: Medicare Other | Source: Ambulatory Visit | Attending: Cardiology | Admitting: Cardiology

## 2012-12-02 NOTE — Progress Notes (Signed)
Pt in today for first cardiac rehab exercise session.  Pt tolerated light exercise with no complaints. Monitor shows SR with frequent PAC's.   Noted on 12 lead ekg from hospital.  PHQ2 0.  Continue to monitor.

## 2012-12-04 ENCOUNTER — Encounter (HOSPITAL_COMMUNITY)
Admission: RE | Admit: 2012-12-04 | Discharge: 2012-12-04 | Disposition: A | Discharge status: Home / Self Care | Payer: Medicare Other | Source: Ambulatory Visit | Attending: Cardiology | Admitting: Cardiology

## 2012-12-06 ENCOUNTER — Encounter (HOSPITAL_COMMUNITY)
Admission: RE | Admit: 2012-12-06 | Discharge: 2012-12-06 | Disposition: A | Discharge status: Home / Self Care | Payer: Medicare Other | Source: Ambulatory Visit | Attending: Cardiology | Admitting: Cardiology

## 2012-12-09 ENCOUNTER — Encounter (HOSPITAL_COMMUNITY)
Admission: RE | Admit: 2012-12-09 | Discharge: 2012-12-09 | Disposition: A | Discharge status: Home / Self Care | Payer: Medicare Other | Source: Ambulatory Visit | Attending: Cardiology | Admitting: Cardiology

## 2012-12-11 ENCOUNTER — Encounter (HOSPITAL_COMMUNITY)
Admission: RE | Admit: 2012-12-11 | Discharge: 2012-12-11 | Disposition: A | Discharge status: Home / Self Care | Payer: Medicare Other | Source: Ambulatory Visit | Attending: Cardiology | Admitting: Cardiology

## 2012-12-13 ENCOUNTER — Encounter (HOSPITAL_COMMUNITY)
Admission: RE | Admit: 2012-12-13 | Discharge: 2012-12-13 | Disposition: A | Discharge status: Home / Self Care | Payer: Medicare Other | Source: Ambulatory Visit | Attending: Cardiology | Admitting: Cardiology

## 2012-12-16 ENCOUNTER — Encounter (HOSPITAL_COMMUNITY)
Admission: RE | Admit: 2012-12-16 | Discharge: 2012-12-16 | Disposition: A | Discharge status: Home / Self Care | Payer: Medicare Other | Source: Ambulatory Visit | Attending: Cardiology | Admitting: Cardiology

## 2012-12-18 ENCOUNTER — Encounter (HOSPITAL_COMMUNITY)
Admission: RE | Admit: 2012-12-18 | Discharge: 2012-12-18 | Disposition: A | Discharge status: Home / Self Care | Payer: Medicare Other | Source: Ambulatory Visit | Attending: Cardiology | Admitting: Cardiology

## 2012-12-18 NOTE — Progress Notes (Signed)
Reviewed home exercise with pt today.  Pt plans to walk at home and attend [employer] at [facility] for exercise.  Reviewed THR, pulse, RPE, sign and symptoms, NTG use, and when to call 911 or MD.  Pt voiced understanding. Fabio Pierce, MA, ACSM RCEP

## 2012-12-20 ENCOUNTER — Encounter (HOSPITAL_COMMUNITY)
Admission: RE | Admit: 2012-12-20 | Discharge: 2012-12-20 | Disposition: A | Discharge status: Home / Self Care | Payer: Medicare Other | Source: Ambulatory Visit | Attending: Cardiology | Admitting: Cardiology

## 2012-12-25 ENCOUNTER — Encounter (HOSPITAL_COMMUNITY)
Admission: RE | Admit: 2012-12-25 | Discharge: 2012-12-25 | Disposition: A | Discharge status: Home / Self Care | Payer: Medicare Other | Source: Ambulatory Visit | Attending: Cardiology | Admitting: Cardiology

## 2012-12-25 DIAGNOSIS — Z79899 Other long term (current) drug therapy: Secondary | ICD-10-CM | POA: Insufficient documentation

## 2012-12-25 DIAGNOSIS — I214 Non-ST elevation (NSTEMI) myocardial infarction: Secondary | ICD-10-CM | POA: Insufficient documentation

## 2012-12-25 DIAGNOSIS — Z5189 Encounter for other specified aftercare: Secondary | ICD-10-CM | POA: Insufficient documentation

## 2012-12-25 DIAGNOSIS — I10 Essential (primary) hypertension: Secondary | ICD-10-CM | POA: Insufficient documentation

## 2012-12-25 DIAGNOSIS — I2510 Atherosclerotic heart disease of native coronary artery without angina pectoris: Secondary | ICD-10-CM | POA: Insufficient documentation

## 2012-12-25 DIAGNOSIS — E785 Hyperlipidemia, unspecified: Secondary | ICD-10-CM | POA: Insufficient documentation

## 2012-12-27 ENCOUNTER — Encounter (HOSPITAL_COMMUNITY)
Admission: RE | Admit: 2012-12-27 | Discharge: 2012-12-27 | Disposition: A | Discharge status: Home / Self Care | Payer: Medicare Other | Source: Ambulatory Visit | Attending: Cardiology | Admitting: Cardiology

## 2012-12-30 ENCOUNTER — Encounter (HOSPITAL_COMMUNITY)
Admission: RE | Admit: 2012-12-30 | Discharge: 2012-12-30 | Disposition: A | Discharge status: Home / Self Care | Payer: Medicare Other | Source: Ambulatory Visit | Attending: Cardiology | Admitting: Cardiology

## 2013-01-01 ENCOUNTER — Encounter (HOSPITAL_COMMUNITY)
Admission: RE | Admit: 2013-01-01 | Discharge: 2013-01-01 | Disposition: A | Discharge status: Home / Self Care | Payer: Medicare Other | Source: Ambulatory Visit | Attending: Cardiology | Admitting: Cardiology

## 2013-01-03 ENCOUNTER — Encounter (HOSPITAL_COMMUNITY)
Admission: RE | Admit: 2013-01-03 | Discharge: 2013-01-03 | Disposition: A | Discharge status: Home / Self Care | Payer: Medicare Other | Source: Ambulatory Visit | Attending: Cardiology | Admitting: Cardiology

## 2013-01-06 ENCOUNTER — Encounter (HOSPITAL_COMMUNITY)
Admission: RE | Admit: 2013-01-06 | Discharge: 2013-01-06 | Disposition: A | Discharge status: Home / Self Care | Payer: Medicare Other | Source: Ambulatory Visit | Attending: Cardiology | Admitting: Cardiology

## 2013-01-08 ENCOUNTER — Encounter (HOSPITAL_COMMUNITY)
Admission: RE | Admit: 2013-01-08 | Discharge: 2013-01-08 | Disposition: A | Discharge status: Home / Self Care | Payer: Medicare Other | Source: Ambulatory Visit | Attending: Cardiology | Admitting: Cardiology

## 2013-01-10 ENCOUNTER — Encounter (HOSPITAL_COMMUNITY)
Admission: RE | Admit: 2013-01-10 | Discharge: 2013-01-10 | Disposition: A | Discharge status: Home / Self Care | Payer: Medicare Other | Source: Ambulatory Visit | Attending: Cardiology | Admitting: Cardiology

## 2013-01-13 ENCOUNTER — Encounter (HOSPITAL_COMMUNITY)
Admission: RE | Admit: 2013-01-13 | Discharge: 2013-01-13 | Disposition: A | Discharge status: Home / Self Care | Payer: Medicare Other | Source: Ambulatory Visit | Attending: Cardiology | Admitting: Cardiology

## 2013-01-15 ENCOUNTER — Encounter (HOSPITAL_COMMUNITY): Admission: RE | Admit: 2013-01-15 | Payer: Medicare Other | Source: Ambulatory Visit

## 2013-01-17 ENCOUNTER — Encounter (HOSPITAL_COMMUNITY)
Admission: RE | Admit: 2013-01-17 | Discharge: 2013-01-17 | Disposition: A | Discharge status: Home / Self Care | Payer: Medicare Other | Source: Ambulatory Visit | Attending: Cardiology | Admitting: Cardiology

## 2013-01-20 ENCOUNTER — Encounter (HOSPITAL_COMMUNITY)
Admission: RE | Admit: 2013-01-20 | Discharge: 2013-01-20 | Disposition: A | Discharge status: Home / Self Care | Payer: Medicare Other | Source: Ambulatory Visit | Attending: Cardiology | Admitting: Cardiology

## 2013-01-22 ENCOUNTER — Encounter (HOSPITAL_COMMUNITY)
Admission: RE | Admit: 2013-01-22 | Discharge: 2013-01-22 | Disposition: A | Discharge status: Home / Self Care | Payer: Medicare Other | Source: Ambulatory Visit | Attending: Cardiology | Admitting: Cardiology

## 2013-01-22 DIAGNOSIS — I10 Essential (primary) hypertension: Secondary | ICD-10-CM | POA: Insufficient documentation

## 2013-01-22 DIAGNOSIS — I2510 Atherosclerotic heart disease of native coronary artery without angina pectoris: Secondary | ICD-10-CM | POA: Insufficient documentation

## 2013-01-22 DIAGNOSIS — Z5189 Encounter for other specified aftercare: Secondary | ICD-10-CM | POA: Insufficient documentation

## 2013-01-22 DIAGNOSIS — Z79899 Other long term (current) drug therapy: Secondary | ICD-10-CM | POA: Insufficient documentation

## 2013-01-22 DIAGNOSIS — E785 Hyperlipidemia, unspecified: Secondary | ICD-10-CM | POA: Insufficient documentation

## 2013-01-22 DIAGNOSIS — I214 Non-ST elevation (NSTEMI) myocardial infarction: Secondary | ICD-10-CM | POA: Insufficient documentation

## 2013-01-24 ENCOUNTER — Encounter (HOSPITAL_COMMUNITY)
Admission: RE | Admit: 2013-01-24 | Discharge: 2013-01-24 | Disposition: A | Discharge status: Home / Self Care | Payer: Medicare Other | Source: Ambulatory Visit | Attending: Cardiology | Admitting: Cardiology

## 2013-01-27 ENCOUNTER — Encounter (HOSPITAL_COMMUNITY)
Admission: RE | Admit: 2013-01-27 | Discharge: 2013-01-27 | Disposition: A | Discharge status: Home / Self Care | Payer: Medicare Other | Source: Ambulatory Visit | Attending: Cardiology | Admitting: Cardiology

## 2013-01-29 ENCOUNTER — Encounter (HOSPITAL_COMMUNITY)
Admission: RE | Admit: 2013-01-29 | Discharge: 2013-01-29 | Disposition: A | Discharge status: Home / Self Care | Payer: Medicare Other | Source: Ambulatory Visit | Attending: Cardiology | Admitting: Cardiology

## 2013-01-31 ENCOUNTER — Encounter (HOSPITAL_COMMUNITY): Admission: RE | Admit: 2013-01-31 | Payer: Medicare Other | Source: Ambulatory Visit

## 2013-02-03 ENCOUNTER — Encounter (HOSPITAL_COMMUNITY): Payer: Medicare Other

## 2013-02-05 ENCOUNTER — Encounter (HOSPITAL_COMMUNITY)
Admission: RE | Admit: 2013-02-05 | Discharge: 2013-02-05 | Disposition: A | Discharge status: Home / Self Care | Payer: Medicare Other | Source: Ambulatory Visit | Attending: Cardiology | Admitting: Cardiology

## 2013-02-07 ENCOUNTER — Encounter (HOSPITAL_COMMUNITY)
Admission: RE | Admit: 2013-02-07 | Discharge: 2013-02-07 | Disposition: A | Discharge status: Home / Self Care | Payer: Medicare Other | Source: Ambulatory Visit | Attending: Cardiology | Admitting: Cardiology

## 2013-02-07 ENCOUNTER — Ambulatory Visit: Payer: Medicare Other | Admitting: Nurse Practitioner

## 2013-02-10 ENCOUNTER — Encounter (HOSPITAL_COMMUNITY)
Admission: RE | Admit: 2013-02-10 | Discharge: 2013-02-10 | Disposition: A | Discharge status: Home / Self Care | Payer: Medicare Other | Source: Ambulatory Visit | Attending: Cardiology | Admitting: Cardiology

## 2013-02-10 ENCOUNTER — Ambulatory Visit (INDEPENDENT_AMBULATORY_CARE_PROVIDER_SITE_OTHER): Payer: Medicare Other | Admitting: Nurse Practitioner

## 2013-02-10 ENCOUNTER — Encounter: Payer: Self-pay | Admitting: Nurse Practitioner

## 2013-02-10 VITALS — BP 100/60 | HR 68 | Resp 20 | Ht 63.0 in | Wt 159.0 lb

## 2013-02-10 DIAGNOSIS — I2510 Atherosclerotic heart disease of native coronary artery without angina pectoris: Secondary | ICD-10-CM

## 2013-02-10 NOTE — Patient Instructions (Signed)
I think you are doing well  Stay on your current medicines  See Dr. Antoine Poche in 4 months with fasting labs  Call the [facility] office at [phone] if you have any questions, problems or concerns.

## 2013-02-10 NOTE — Progress Notes (Signed)
Nancy Wise Date of Birth: 11-06-1929 Medical Record #[id number]  History of Present Illness: Nancy Wise is seen back today for a 3 month check. Seen for Dr. Antoine Poche. She has had CAD with recent NSTEMI back in June of 2013 - treated with DES to the LAD - committed to DAPT for at least one year. EF was normal. Other issues include HTN, HLD and obesity.   I saw her back in July - she was doing well.  Comes back today. Here alone. Doing well. No chest pain. Not short of breath. Some fatigue which is chronic. Tolerating her medicines. Not dizzy or lightheaded. Finishes up next month with cardiac rehab.   Current Outpatient Prescriptions  Medication Sig Dispense Refill  . aspirin EC 81 MG tablet Take 81 mg by mouth every morning.      [name] atorvastatin (LIPITOR) 80 MG tablet Take 1 tablet (80 mg total) by mouth daily at 6 PM.  30 tablet  6  . CALCIUM CARBONATE-VITAMIN D PO Take by mouth every morning.      . clopidogrel (PLAVIX) 75 MG tablet Take 1 tablet (75 mg total) by mouth daily with breakfast.  30 tablet  6  . famotidine (PEPCID) 20 MG tablet Take 1 tablet (20 mg total) by mouth 2 (two) times daily as needed for heartburn.      . hydrochlorothiazide (HYDRODIURIL) 25 MG tablet Take 25 mg by mouth every morning.      . metoprolol tartrate (LOPRESSOR) 25 MG tablet Take 1 tablet (25 mg total) by mouth 2 (two) times daily.  60 tablet  6  . Multiple Vitamin (MULTIVITAMIN WITH MINERALS) TABS tablet Take 1 tablet by mouth every morning.      . potassium chloride SA (K-DUR,KLOR-CON) 20 MEQ tablet Take 20 mEq by mouth every morning.      . nitroGLYCERIN (NITROSTAT) 0.4 MG SL tablet Place 1 tablet (0.4 mg total) under the tongue every 5 (five) minutes x 3 doses as needed for chest pain.  25 tablet  3   No current facility-administered medications for this visit.    No Known Allergies  Past Medical History  Diagnosis Date  . Hypertension   . Bladder retention   . Melanoma of foot   . Basal  cell carcinoma of nose   . Coronary artery disease     a. 09/2012 NSTEMI/Cath/PCI: LM nl, LAD 80-35m (2.25x16 Promus Premier DES), LCX nl, RCA 30-40 diff in PL, 30 PDA, EF 55-60%.  . Obesity     Past Surgical History  Procedure Laterality Date  . Tonsillectomy  1930's  . Cesarean section  1968  . Coronary angioplasty with stent placement  09/30/2012    "1" (09/30/2012)  . Melanoma excision Left 2013    'foot" (09/30/2012)  . Basal cell carcinoma excision Right 2011?    "nose" (09/30/2012)    History  Smoking status  . Never Smoker   Smokeless tobacco  . Never Used    History  Alcohol Use No    Family History  Problem Relation Age of Onset  . Cancer Sister 65    Lung    Review of Systems: The review of systems is per the HPI.  All other systems were reviewed and are negative.  Physical Exam: BP 100/60  Pulse 68  Resp 20  Ht 5\' 3"  (1.6 m)  Wt 159 lb (72.122 kg)  BMI 28.17 kg/m2 Patient is very pleasant and in no acute distress. Skin is warm and dry.  Color is normal.  HEENT is unremarkable. Normocephalic/atraumatic. PERRL. Sclera are nonicteric. Neck is supple. No masses. No JVD. Lungs are clear. Cardiac exam shows a regular rate and rhythm. Abdomen is soft. Extremities are without edema. Gait and ROM are intact. No gross neurologic deficits noted.  LABORATORY DATA:  Lab Results  Component Value Date   WBC 7.2 10/16/2012   HGB 13.6 10/16/2012   HCT 40.3 10/16/2012   PLT 245 10/16/2012   GLUCOSE 108* 10/16/2012   CHOL 183 09/30/2012   TRIG 56 09/30/2012   HDL 63 09/30/2012   LDLCALC 454* 09/30/2012   ALT 16 09/29/2012   AST 21 09/29/2012   NA 141 10/16/2012   K 3.9 10/16/2012   CL 103 10/16/2012   CREATININE 0.89 10/16/2012   BUN 15 10/16/2012   CO2 30 10/16/2012   TSH 1.350 09/29/2012   INR 1.01 09/29/2012    Assessment / Plan: 1. NSTEMI - June 2013 - treated with DES to the LAD - doing well - committed to DAPT at least until June of 2015. See Dr. Antoine Poche back in 4 months.  2.  HTN - blood pressure looks great. No change in therapy. She is also monitored at cardiac rehab.   3. HLD - on statin - needs fasting labs on return.  Patient is agreeable to this plan and will call if any problems develop in the interim.   Rosalio Macadamia, RN, ANP-C [facility] [address] [place], [place]  [address]

## 2013-02-12 ENCOUNTER — Encounter (HOSPITAL_COMMUNITY)
Admission: RE | Admit: 2013-02-12 | Discharge: 2013-02-12 | Disposition: A | Discharge status: Home / Self Care | Payer: Medicare Other | Source: Ambulatory Visit | Attending: Cardiology | Admitting: Cardiology

## 2013-02-12 NOTE — Progress Notes (Signed)
Nancy Wise 79 y.o. female Nutrition Note Spoke with pt.  Nutrition Plan and Nutrition Survey goals reviewed with pt. Pt is following Step 1 of the Therapeutic Lifestyle Changes diet. Pt expressed understanding of the information reviewed. Pt aware of nutrition education classes offered.  Nutrition Diagnosis   Food-and nutrition-related knowledge deficit related to lack of exposure to information as related to diagnosis of: ? CVD  Nutrition RX/ Estimated Daily Nutrition Needs for: wt maintenance 1700-1900 Kcal, 55-60 gm fat, 11-13 gm sat fat, 1.6-1.9 gm trans-fat, <1500 mg sodium   Nutrition Intervention   Pt's individual nutrition plan including cholesterol goals reviewed with pt.   Benefits of adopting Therapeutic Lifestyle Changes discussed when Medficts reviewed.   Pt to attend the Portion Distortion class   Pt to attend the  ? Nutrition I class                     ? Nutrition II class   Continue client-centered nutrition education by RD, as part of interdisciplinary care.  Goal(s)   Pt to describe the benefit of including fruits, vegetables, whole grains, and low-fat dairy products in a heart healthy meal plan.  Monitor and Evaluate progress toward nutrition goal with team. Nutrition Risk:  Low  Mickle Plumb, M.Ed, RD, LDN, CDE 02/12/2013 2:53 PM

## 2013-02-14 ENCOUNTER — Encounter (HOSPITAL_COMMUNITY)
Admission: RE | Admit: 2013-02-14 | Discharge: 2013-02-14 | Disposition: A | Discharge status: Home / Self Care | Payer: Medicare Other | Source: Ambulatory Visit | Attending: Cardiology | Admitting: Cardiology

## 2013-02-17 ENCOUNTER — Encounter (HOSPITAL_COMMUNITY): Payer: Medicare Other

## 2013-02-19 ENCOUNTER — Encounter (HOSPITAL_COMMUNITY)
Admission: RE | Admit: 2013-02-19 | Discharge: 2013-02-19 | Disposition: A | Discharge status: Home / Self Care | Payer: Medicare Other | Source: Ambulatory Visit | Attending: Cardiology | Admitting: Cardiology

## 2013-02-21 ENCOUNTER — Encounter (HOSPITAL_COMMUNITY)
Admission: RE | Admit: 2013-02-21 | Discharge: 2013-02-21 | Disposition: A | Discharge status: Home / Self Care | Payer: Medicare Other | Source: Ambulatory Visit | Attending: Cardiology | Admitting: Cardiology

## 2013-02-24 ENCOUNTER — Encounter (HOSPITAL_COMMUNITY)
Admission: RE | Admit: 2013-02-24 | Discharge: 2013-02-24 | Disposition: A | Discharge status: Home / Self Care | Payer: Medicare Other | Source: Ambulatory Visit | Attending: Cardiology | Admitting: Cardiology

## 2013-02-24 DIAGNOSIS — I2510 Atherosclerotic heart disease of native coronary artery without angina pectoris: Secondary | ICD-10-CM | POA: Insufficient documentation

## 2013-02-24 DIAGNOSIS — I10 Essential (primary) hypertension: Secondary | ICD-10-CM | POA: Insufficient documentation

## 2013-02-24 DIAGNOSIS — I214 Non-ST elevation (NSTEMI) myocardial infarction: Secondary | ICD-10-CM | POA: Insufficient documentation

## 2013-02-24 DIAGNOSIS — E785 Hyperlipidemia, unspecified: Secondary | ICD-10-CM | POA: Insufficient documentation

## 2013-02-24 DIAGNOSIS — Z79899 Other long term (current) drug therapy: Secondary | ICD-10-CM | POA: Insufficient documentation

## 2013-02-24 DIAGNOSIS — Z5189 Encounter for other specified aftercare: Secondary | ICD-10-CM | POA: Insufficient documentation

## 2013-02-26 ENCOUNTER — Encounter (HOSPITAL_COMMUNITY)
Admission: RE | Admit: 2013-02-26 | Discharge: 2013-02-26 | Disposition: A | Discharge status: Home / Self Care | Payer: Medicare Other | Source: Ambulatory Visit | Attending: Cardiology | Admitting: Cardiology

## 2013-02-28 ENCOUNTER — Encounter (HOSPITAL_COMMUNITY)
Admission: RE | Admit: 2013-02-28 | Discharge: 2013-02-28 | Disposition: A | Discharge status: Home / Self Care | Payer: Medicare Other | Source: Ambulatory Visit | Attending: Cardiology | Admitting: Cardiology

## 2013-03-03 ENCOUNTER — Encounter (HOSPITAL_COMMUNITY)
Admission: RE | Admit: 2013-03-03 | Discharge: 2013-03-03 | Disposition: A | Discharge status: Home / Self Care | Payer: Medicare Other | Source: Ambulatory Visit | Attending: Cardiology | Admitting: Cardiology

## 2013-03-05 ENCOUNTER — Encounter (HOSPITAL_COMMUNITY)
Admission: RE | Admit: 2013-03-05 | Discharge: 2013-03-05 | Disposition: A | Discharge status: Home / Self Care | Payer: Medicare Other | Source: Ambulatory Visit | Attending: Cardiology | Admitting: Cardiology

## 2013-03-05 NOTE — Progress Notes (Signed)
Pt completed 36 exercise sessions.  Pt maintained excellent attendance and showed some progress with improvement of mobility.  Pt would benefit from continued encouragement and support to maintain progress made and continued exercise. Pt repeat PHQ2 score 0.  Pt plans to continue home exercise with walking,  Medication list reconciled.

## 2013-03-07 ENCOUNTER — Encounter (HOSPITAL_COMMUNITY): Payer: Medicare Other

## 2013-04-03 ENCOUNTER — Other Ambulatory Visit (HOSPITAL_COMMUNITY): Payer: Self-pay | Admitting: Cardiology

## 2013-04-04 ENCOUNTER — Other Ambulatory Visit (HOSPITAL_COMMUNITY): Payer: Self-pay | Admitting: Nurse Practitioner

## 2013-04-08 ENCOUNTER — Other Ambulatory Visit (HOSPITAL_COMMUNITY): Payer: Self-pay | Admitting: Nurse Practitioner

## 2013-04-16 ENCOUNTER — Other Ambulatory Visit: Payer: Self-pay

## 2013-04-16 ENCOUNTER — Other Ambulatory Visit (HOSPITAL_COMMUNITY): Payer: Self-pay | Admitting: Nurse Practitioner

## 2013-04-16 MED ORDER — ATORVASTATIN CALCIUM 80 MG PO TABS: 80.0000 mg | ORAL_TABLET | Freq: Every day | ORAL | Status: DC

## 2013-05-01 ENCOUNTER — Other Ambulatory Visit (HOSPITAL_COMMUNITY): Payer: Self-pay | Admitting: Nurse Practitioner

## 2013-05-14 ENCOUNTER — Other Ambulatory Visit (HOSPITAL_COMMUNITY): Payer: Self-pay | Admitting: Internal Medicine

## 2013-05-14 DIAGNOSIS — Z1231 Encounter for screening mammogram for malignant neoplasm of breast: Secondary | ICD-10-CM

## 2013-05-15 ENCOUNTER — Other Ambulatory Visit (HOSPITAL_COMMUNITY): Payer: Self-pay | Admitting: Nurse Practitioner

## 2013-05-21 ENCOUNTER — Other Ambulatory Visit (HOSPITAL_COMMUNITY): Payer: Self-pay | Admitting: Nurse Practitioner

## 2013-05-22 ENCOUNTER — Other Ambulatory Visit (HOSPITAL_COMMUNITY): Payer: Self-pay | Admitting: Nurse Practitioner

## 2013-06-06 ENCOUNTER — Other Ambulatory Visit (HOSPITAL_COMMUNITY): Payer: Self-pay | Admitting: Cardiology

## 2013-06-12 ENCOUNTER — Ambulatory Visit: Payer: Medicare Other | Admitting: Cardiology

## 2013-06-26 ENCOUNTER — Ambulatory Visit (HOSPITAL_COMMUNITY)
Admission: RE | Admit: 2013-06-26 | Discharge: 2013-06-26 | Disposition: A | Discharge status: Home / Self Care | Payer: Medicare Other | Source: Ambulatory Visit | Attending: Internal Medicine | Admitting: Internal Medicine

## 2013-06-26 DIAGNOSIS — Z1231 Encounter for screening mammogram for malignant neoplasm of breast: Secondary | ICD-10-CM | POA: Insufficient documentation

## 2013-07-02 ENCOUNTER — Other Ambulatory Visit (HOSPITAL_COMMUNITY): Payer: Self-pay | Admitting: Cardiology

## 2013-07-04 ENCOUNTER — Other Ambulatory Visit (HOSPITAL_COMMUNITY): Payer: Self-pay | Admitting: Internal Medicine

## 2013-07-04 DIAGNOSIS — M8580 Other specified disorders of bone density and structure, unspecified site: Secondary | ICD-10-CM

## 2013-07-11 ENCOUNTER — Ambulatory Visit (HOSPITAL_COMMUNITY)
Admission: RE | Admit: 2013-07-11 | Discharge: 2013-07-11 | Disposition: A | Discharge status: Home / Self Care | Payer: Medicare Other | Source: Ambulatory Visit | Attending: Internal Medicine | Admitting: Internal Medicine

## 2013-07-11 ENCOUNTER — Other Ambulatory Visit (HOSPITAL_COMMUNITY): Payer: Self-pay | Admitting: Internal Medicine

## 2013-07-11 DIAGNOSIS — M8580 Other specified disorders of bone density and structure, unspecified site: Secondary | ICD-10-CM

## 2013-07-11 DIAGNOSIS — Z780 Asymptomatic menopausal state: Secondary | ICD-10-CM | POA: Insufficient documentation

## 2013-07-11 DIAGNOSIS — Z13820 Encounter for screening for osteoporosis: Secondary | ICD-10-CM | POA: Insufficient documentation

## 2013-08-01 ENCOUNTER — Other Ambulatory Visit: Payer: Self-pay

## 2013-08-01 MED ORDER — METOPROLOL TARTRATE 25 MG PO TABS: ORAL_TABLET | ORAL | Status: DC

## 2013-08-01 MED ORDER — CLOPIDOGREL BISULFATE 75 MG PO TABS: ORAL_TABLET | ORAL | Status: DC

## 2013-08-04 ENCOUNTER — Ambulatory Visit (INDEPENDENT_AMBULATORY_CARE_PROVIDER_SITE_OTHER): Payer: Medicare Other | Admitting: Cardiology

## 2013-08-04 ENCOUNTER — Encounter: Payer: Self-pay | Admitting: Cardiology

## 2013-08-04 VITALS — BP 126/64 | HR 60 | Ht 63.0 in | Wt 161.4 lb

## 2013-08-04 DIAGNOSIS — I2510 Atherosclerotic heart disease of native coronary artery without angina pectoris: Secondary | ICD-10-CM

## 2013-08-04 NOTE — Patient Instructions (Signed)
The current medical regimen is effective;  continue present plan and medications.  Follow up in 1 year with Dr Hochrein.  You will receive a letter in the mail 2 months before you are due.  Please call us when you receive this letter to schedule your follow up appointment.  

## 2013-08-04 NOTE — Progress Notes (Signed)
HPI The patient returns for follow up of CAD.  Since I last saw her she has done well.  The patient denies any new symptoms such as chest discomfort, neck or arm discomfort. There has been no new shortness of breath, PND or orthopnea. There have been no reported palpitations, presyncope or syncope.  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  She is having no problems with bleeding or bruising or DAPT.  Her lipids recently were excellent as followed by GREEN, Keenan Bachelor, MD.  I did review these results which he kindly sent along.   No Known Allergies  Current Outpatient Prescriptions  Medication Sig Dispense Refill  . aspirin EC 81 MG tablet Take 81 mg by mouth every morning.      [name] atorvastatin (LIPITOR) 80 MG tablet Take 1 tablet (80 mg total) by mouth daily at 6 PM.  30 tablet  6  . CALCIUM CARBONATE-VITAMIN D PO Take by mouth every morning.      . clopidogrel (PLAVIX) 75 MG tablet TAKE 1 TABLET BY MOUTH EVERY MORNING WITH BREAKFAST  30 tablet  0  . famotidine (PEPCID) 20 MG tablet Take 1 tablet (20 mg total) by mouth 2 (two) times daily as needed for heartburn.      . hydrochlorothiazide (HYDRODIURIL) 25 MG tablet Take 25 mg by mouth every morning.      . metoprolol tartrate (LOPRESSOR) 25 MG tablet TAKE 1 TABLET (25 MG TOTAL) BY MOUTH 2 (TWO) TIMES DAILY.  60 tablet  1  . Multiple Vitamin (MULTIVITAMIN WITH MINERALS) TABS tablet Take 1 tablet by mouth every morning.      . nitroGLYCERIN (NITROSTAT) 0.4 MG SL tablet Place 1 tablet (0.4 mg total) under the tongue every 5 (five) minutes x 3 doses as needed for chest pain.  25 tablet  3  . potassium chloride SA (K-DUR,KLOR-CON) 20 MEQ tablet Take 20 mEq by mouth every morning.      . potassium chloride SA (KLOR-CON M20) 20 MEQ tablet Take 1 tablet (20 mEq total) by mouth daily.  30 tablet  3   No current facility-administered medications for this visit.    Past Medical History  Diagnosis Date  . Hypertension     . Bladder retention   . Melanoma of foot   . Basal cell carcinoma of nose   . Coronary artery disease     a. 09/2012 NSTEMI/Cath/PCI: LM nl, LAD 80-20m (2.25x16 Promus Premier DES), LCX nl, RCA 30-40 diff in PL, 30 PDA, EF 55-60%.  . Obesity     Past Surgical History  Procedure Laterality Date  . Tonsillectomy  1930's  . Cesarean section  1968  . Coronary angioplasty with stent placement  09/30/2012    "1" (09/30/2012)  . Melanoma excision Left 2013    'foot" (09/30/2012)  . Basal cell carcinoma excision Right 2011?    "nose" (09/30/2012)    ROS:  As stated in the HPI and negative for all other systems.  PHYSICAL EXAM BP 126/64  Pulse 60  Ht 5\' 3"  (1.6 m)  Wt 161 lb 6.4 oz (73.211 kg)  BMI 28.60 kg/m2 GENERAL:  Well appearing NECK:  No jugular venous distention, waveform within normal limits, carotid upstroke brisk and symmetric, no bruits, no thyromegaly LUNGS:  Clear to auscultation bilaterally BACK:  No CVA tenderness CHEST:  Unremarkable HEART:  PMI not displaced or sustained,S1 and S2 within normal limits, no S3, no S4, no clicks,  no rubs, no murmurs ABD:  Flat, positive bowel sounds normal in frequency in pitch, no bruits, no rebound, no guarding, no midline pulsatile mass, no hepatomegaly, no splenomegaly EXT:  2 plus pulses throughout, no edema, no cyanosis no clubbing   EKG:  Sinus rhythm, rate 60, axis within normal limits, intervals within normal limits, no acute ST-T wave changes.  08/04/2013   ASSESSMENT AND PLAN  CAD: The patient has no new sypmtoms.  No further cardiovascular testing is indicated.  We will continue with aggressive risk reduction and meds as listed.  I will, based on current data, continue DAPT for now.  We will discuss this at further appts.  HTN:  The blood pressure is at target. No change in medications is indicated. We will continue with therapeutic lifestyle changes (TLC).

## 2013-09-05 ENCOUNTER — Other Ambulatory Visit: Payer: Self-pay

## 2013-09-05 MED ORDER — CLOPIDOGREL BISULFATE 75 MG PO TABS: ORAL_TABLET | ORAL | Status: DC

## 2013-09-10 ENCOUNTER — Other Ambulatory Visit: Payer: Self-pay

## 2013-09-10 MED ORDER — METOPROLOL TARTRATE 25 MG PO TABS: ORAL_TABLET | ORAL | Status: DC

## 2013-09-22 ENCOUNTER — Other Ambulatory Visit: Payer: Self-pay | Admitting: *Deleted

## 2013-09-22 MED ORDER — POTASSIUM CHLORIDE CRYS ER 20 MEQ PO TBCR: 20.0000 meq | EXTENDED_RELEASE_TABLET | Freq: Every day | ORAL | Status: DC

## 2013-10-21 ENCOUNTER — Other Ambulatory Visit: Payer: Self-pay | Admitting: *Deleted

## 2013-10-21 MED ORDER — POTASSIUM CHLORIDE CRYS ER 20 MEQ PO TBCR: 20.0000 meq | EXTENDED_RELEASE_TABLET | Freq: Every day | ORAL | Status: DC

## 2013-10-21 MED ORDER — CLOPIDOGREL BISULFATE 75 MG PO TABS: ORAL_TABLET | ORAL | Status: DC

## 2013-10-21 MED ORDER — ATORVASTATIN CALCIUM 80 MG PO TABS: 80.0000 mg | ORAL_TABLET | Freq: Every day | ORAL | Status: DC

## 2013-12-17 ENCOUNTER — Other Ambulatory Visit: Payer: Self-pay | Admitting: Dermatology

## 2014-01-09 ENCOUNTER — Encounter (HOSPITAL_COMMUNITY): Payer: Self-pay | Admitting: Emergency Medicine

## 2014-01-09 ENCOUNTER — Inpatient Hospital Stay (HOSPITAL_COMMUNITY)
Admission: EM | Admit: 2014-01-09 | Discharge: 2014-01-12 | DRG: 287 | Disposition: A | Discharge status: Home / Self Care | Payer: Medicare Other | Attending: Cardiology | Admitting: Cardiology

## 2014-01-09 ENCOUNTER — Emergency Department (HOSPITAL_COMMUNITY): Payer: Medicare Other

## 2014-01-09 DIAGNOSIS — R079 Chest pain, unspecified: Secondary | ICD-10-CM | POA: Diagnosis present

## 2014-01-09 DIAGNOSIS — Z85828 Personal history of other malignant neoplasm of skin: Secondary | ICD-10-CM | POA: Diagnosis not present

## 2014-01-09 DIAGNOSIS — I252 Old myocardial infarction: Secondary | ICD-10-CM | POA: Diagnosis not present

## 2014-01-09 DIAGNOSIS — I10 Essential (primary) hypertension: Secondary | ICD-10-CM | POA: Diagnosis present

## 2014-01-09 DIAGNOSIS — E785 Hyperlipidemia, unspecified: Secondary | ICD-10-CM | POA: Diagnosis present

## 2014-01-09 DIAGNOSIS — Z7982 Long term (current) use of aspirin: Secondary | ICD-10-CM

## 2014-01-09 DIAGNOSIS — Z801 Family history of malignant neoplasm of trachea, bronchus and lung: Secondary | ICD-10-CM

## 2014-01-09 DIAGNOSIS — Z7902 Long term (current) use of antithrombotics/antiplatelets: Secondary | ICD-10-CM | POA: Diagnosis not present

## 2014-01-09 DIAGNOSIS — I2510 Atherosclerotic heart disease of native coronary artery without angina pectoris: Secondary | ICD-10-CM | POA: Diagnosis present

## 2014-01-09 DIAGNOSIS — I25110 Atherosclerotic heart disease of native coronary artery with unstable angina pectoris: Secondary | ICD-10-CM

## 2014-01-09 DIAGNOSIS — Z9861 Coronary angioplasty status: Secondary | ICD-10-CM | POA: Diagnosis not present

## 2014-01-09 DIAGNOSIS — Z85820 Personal history of malignant melanoma of skin: Secondary | ICD-10-CM | POA: Diagnosis not present

## 2014-01-09 DIAGNOSIS — I200 Unstable angina: Secondary | ICD-10-CM

## 2014-01-09 LAB — CBC WITH DIFFERENTIAL/PLATELET
BASOS PCT: 0 % (ref 0–1)
Basophils Absolute: 0 10*3/uL (ref 0.0–0.1)
EOS PCT: 2 % (ref 0–5)
Eosinophils Absolute: 0.1 10*3/uL (ref 0.0–0.7)
HEMATOCRIT: 42.7 % (ref 36.0–46.0)
HEMOGLOBIN: 13.7 g/dL (ref 12.0–15.0)
Lymphocytes Relative: 26 % (ref 12–46)
Lymphs Abs: 1.6 10*3/uL (ref 0.7–4.0)
MCH: 27.2 pg (ref 26.0–34.0)
MCHC: 32.1 g/dL (ref 30.0–36.0)
MCV: 84.7 fL (ref 78.0–100.0)
MONO ABS: 0.3 10*3/uL (ref 0.1–1.0)
MONOS PCT: 5 % (ref 3–12)
Neutro Abs: 4.1 10*3/uL (ref 1.7–7.7)
Neutrophils Relative %: 67 % (ref 43–77)
Platelets: 175 10*3/uL (ref 150–400)
RBC: 5.04 MIL/uL (ref 3.87–5.11)
RDW: 14.1 % (ref 11.5–15.5)
WBC: 6.2 10*3/uL (ref 4.0–10.5)

## 2014-01-09 LAB — BASIC METABOLIC PANEL
ANION GAP: 11 (ref 5–15)
BUN: 19 mg/dL (ref 6–23)
CALCIUM: 9.3 mg/dL (ref 8.4–10.5)
CO2: 31 mEq/L (ref 19–32)
Chloride: 101 mEq/L (ref 96–112)
Creatinine, Ser: 0.92 mg/dL (ref 0.50–1.10)
GFR, EST AFRICAN AMERICAN: 65 mL/min — AB (ref >90)
GFR, EST NON AFRICAN AMERICAN: 56 mL/min — AB (ref >90)
Glucose, Bld: 142 mg/dL — ABNORMAL HIGH (ref 70–99)
Potassium: 3.8 mEq/L (ref 3.7–5.3)
Sodium: 143 mEq/L (ref 137–147)

## 2014-01-09 LAB — PRO B NATRIURETIC PEPTIDE: Pro B Natriuretic peptide (BNP): 145.8 pg/mL (ref 0–450)

## 2014-01-09 LAB — I-STAT TROPONIN, ED: Troponin i, poc: 0 ng/mL (ref 0.00–0.08)

## 2014-01-09 LAB — TROPONIN I

## 2014-01-09 MED ORDER — ACETAMINOPHEN 325 MG PO TABS: 650.0000 mg | ORAL_TABLET | ORAL | Status: DC | PRN

## 2014-01-09 MED ORDER — ASPIRIN EC 81 MG PO TBEC: 81.0000 mg | DELAYED_RELEASE_TABLET | Freq: Every morning | ORAL | Status: DC

## 2014-01-09 MED ORDER — NITROGLYCERIN 0.4 MG SL SUBL: 0.4000 mg | SUBLINGUAL_TABLET | SUBLINGUAL | Status: DC | PRN

## 2014-01-09 MED ORDER — POTASSIUM CHLORIDE CRYS ER 20 MEQ PO TBCR
20.0000 meq | EXTENDED_RELEASE_TABLET | Freq: Every day | ORAL | Status: DC
  Administered 2014-01-10 – 2014-01-12 (×3): 20 meq via ORAL
  Filled 2014-01-09 (×3): qty 1

## 2014-01-09 MED ORDER — ATORVASTATIN CALCIUM 80 MG PO TABS
80.0000 mg | ORAL_TABLET | Freq: Every day | ORAL | Status: DC
  Administered 2014-01-10 – 2014-01-11 (×2): 80 mg via ORAL
  Filled 2014-01-09 (×3): qty 1

## 2014-01-09 MED ORDER — ASPIRIN EC 81 MG PO TBEC
81.0000 mg | DELAYED_RELEASE_TABLET | Freq: Every morning | ORAL | Status: DC
  Administered 2014-01-10 – 2014-01-11 (×2): 81 mg via ORAL
  Filled 2014-01-09 (×3): qty 1

## 2014-01-09 MED ORDER — CALCIUM CARBONATE-VITAMIN D 500-200 MG-UNIT PO TABS
1.0000 | ORAL_TABLET | Freq: Every day | ORAL | Status: DC
  Administered 2014-01-10 – 2014-01-12 (×3): 1 via ORAL
  Filled 2014-01-09 (×5): qty 1

## 2014-01-09 MED ORDER — SODIUM CHLORIDE 0.9 % IV SOLN: INTRAVENOUS | Status: DC

## 2014-01-09 MED ORDER — ADULT MULTIVITAMIN W/MINERALS CH
1.0000 | ORAL_TABLET | Freq: Every morning | ORAL | Status: DC
  Administered 2014-01-10 – 2014-01-12 (×3): 1 via ORAL
  Filled 2014-01-09 (×3): qty 1

## 2014-01-09 MED ORDER — SODIUM CHLORIDE 0.9 % IJ SOLN: 3.0000 mL | INTRAMUSCULAR | Status: DC | PRN

## 2014-01-09 MED ORDER — ASPIRIN 81 MG PO CHEW: 81.0000 mg | CHEWABLE_TABLET | ORAL | Status: DC

## 2014-01-09 MED ORDER — ASPIRIN 81 MG PO CHEW
324.0000 mg | CHEWABLE_TABLET | Freq: Once | ORAL | Status: AC
  Administered 2014-01-09: 243 mg via ORAL
  Filled 2014-01-09: qty 4

## 2014-01-09 MED ORDER — FAMOTIDINE 20 MG PO TABS
20.0000 mg | ORAL_TABLET | Freq: Two times a day (BID) | ORAL | Status: DC | PRN
  Filled 2014-01-09: qty 1

## 2014-01-09 MED ORDER — ONDANSETRON HCL 4 MG/2ML IJ SOLN
4.0000 mg | Freq: Four times a day (QID) | INTRAMUSCULAR | Status: DC | PRN
Start: 2014-01-09 — End: 2014-01-12

## 2014-01-09 MED ORDER — HYDROCHLOROTHIAZIDE 25 MG PO TABS
25.0000 mg | ORAL_TABLET | Freq: Every morning | ORAL | Status: DC
  Administered 2014-01-10 – 2014-01-11 (×2): 25 mg via ORAL
  Filled 2014-01-09 (×3): qty 1

## 2014-01-09 MED ORDER — SODIUM CHLORIDE 0.9 % IV SOLN: 250.0000 mL | INTRAVENOUS | Status: DC | PRN

## 2014-01-09 MED ORDER — HEPARIN BOLUS VIA INFUSION
4000.0000 [IU] | Freq: Once | INTRAVENOUS | Status: AC
  Administered 2014-01-09: 4000 [IU] via INTRAVENOUS
  Filled 2014-01-09: qty 4000

## 2014-01-09 MED ORDER — HEPARIN (PORCINE) IN NACL 100-0.45 UNIT/ML-% IJ SOLN
600.0000 [IU]/h | INTRAMUSCULAR | Status: DC
  Administered 2014-01-09: 850 [IU]/h via INTRAVENOUS
  Administered 2014-01-10: 750 [IU]/h via INTRAVENOUS
  Administered 2014-01-11: 600 [IU]/h via INTRAVENOUS
  Filled 2014-01-09 (×3): qty 250

## 2014-01-09 MED ORDER — METOPROLOL TARTRATE 25 MG PO TABS
25.0000 mg | ORAL_TABLET | Freq: Two times a day (BID) | ORAL | Status: DC
  Administered 2014-01-09 – 2014-01-12 (×6): 25 mg via ORAL
  Filled 2014-01-09 (×7): qty 1

## 2014-01-09 MED ORDER — CLOPIDOGREL BISULFATE 75 MG PO TABS
75.0000 mg | ORAL_TABLET | Freq: Every day | ORAL | Status: DC
  Administered 2014-01-10 – 2014-01-12 (×3): 75 mg via ORAL
  Filled 2014-01-09 (×3): qty 1

## 2014-01-09 MED ORDER — SODIUM CHLORIDE 0.9 % IJ SOLN
3.0000 mL | Freq: Two times a day (BID) | INTRAMUSCULAR | Status: DC
  Administered 2014-01-09 – 2014-01-11 (×2): 3 mL via INTRAVENOUS

## 2014-01-09 NOTE — Progress Notes (Signed)
ANTICOAGULATION CONSULT NOTE - Initial Consult  Pharmacy Consult for Heparin Indication: chest pain/ACS  No Known Allergies  Patient Measurements: 73 kg  Vital Signs: Temp: 98.5 F (36.9 C) (09/18 1153) Temp src: Oral (09/18 1153) BP: 108/73 mmHg (09/18 1830) Pulse Rate: 58 (09/18 1830)  Labs:  Recent Labs  01/09/14 1203  HGB 13.7  HCT 42.7  PLT 175  CREATININE 0.92    The CrCl is unknown because both a height and weight (above a minimum accepted value) are required for this calculation.   Medical History: Past Medical History  Diagnosis Date  . Hypertension   . Bladder retention   . Melanoma of foot   . Basal cell carcinoma of nose   . Coronary artery disease     a. 09/2012 NSTEMI/Cath/PCI: LM nl, LAD 80-39m (2.25x16 Promus Premier DES), LCX nl, RCA 30-40 diff in PL, 30 PDA, EF 55-60%.  . Obesity     Assessment: 80 year old female to begin IV heparin for ACS  PMH: HTN, HLD, CAD with stent to LAD 6/14 Cath planned for Monday Scr stable  Goal of Therapy:  Heparin level 0.3-0.7 units/ml Monitor platelets by anticoagulation protocol: Yes   Plan:  Heparin 4000 units iv bolus x 1 Heparin drip at 850 units / hr Daily labs  Thank you. Anette Guarneri, PharmD [phone]  Tad Moore 01/09/2014,7:46 PM

## 2014-01-09 NOTE — ED Notes (Signed)
Ordered diet tray 

## 2014-01-09 NOTE — ED Provider Notes (Addendum)
CSN: [id number]     Arrival date & time 01/09/14  1146 History   First MD Initiated Contact with Patient 01/09/14 1604     Chief Complaint  Patient presents with  . Chest Pain    The patient said she started having chest pain and was light headed since 1000.  The patient denies any other symptoms other than being lightheaded.     (Consider location/radiation/quality/duration/timing/severity/associated sxs/prior Treatment) HPI Comments: Patient is a 80 year old female patient who presents with chest pain. She has a history of hypertension, hyperlipidemia and coronary artery disease. She was admitted in 2014 with a NSTEMI and had stenting of the LAD at that time. She states that this morning when she got up and started walking around she had onset of discomfort in the center of her chest. She describes it as a uncomfortable feeling. It was not a sharp pain. It is nonradiating. It did not seem to be related to exertion. She did have associated lightheadedness. There is no shortness of breath nausea or diaphoresis. It lasted about 2 hours. She cannot say whether it felt similar or dissimilar to the pain that she had with the NSTEMI last year. She denies any recent cough congestion or other recent illnesses.  Patient is a 80 y.o. female presenting with chest pain.  Chest Pain Associated symptoms: fatigue   Associated symptoms: no abdominal pain, no back pain, no cough, no diaphoresis, no dizziness, no fever, no headache, no nausea, no numbness, no shortness of breath, not vomiting and no weakness     Past Medical History  Diagnosis Date  . Hypertension   . Bladder retention   . Melanoma of foot   . Basal cell carcinoma of nose   . Coronary artery disease     a. 09/2012 NSTEMI/Cath/PCI: LM nl, LAD 80-83m (2.25x16 Promus Premier DES), LCX nl, RCA 30-40 diff in PL, 30 PDA, EF 55-60%.  . Obesity    Past Surgical History  Procedure Laterality Date  . Tonsillectomy  1930's  . Cesarean section   1968  . Coronary angioplasty with stent placement  09/30/2012    "1" (09/30/2012)  . Melanoma excision Left 2013    'foot" (09/30/2012)  . Basal cell carcinoma excision Right 2011?    "nose" (09/30/2012)   Family History  Problem Relation Age of Onset  . Cancer Sister 12    Lung   History  Substance Use Topics  . Smoking status: Never Smoker   . Smokeless tobacco: Never Used  . Alcohol Use: No   OB History   Grav Para Term Preterm Abortions TAB SAB Ect Mult Living                 Review of Systems  Constitutional: Positive for fatigue. Negative for fever, chills and diaphoresis.  HENT: Negative for congestion, rhinorrhea and sneezing.   Eyes: Negative.   Respiratory: Positive for chest tightness. Negative for cough and shortness of breath.   Cardiovascular: Positive for chest pain. Negative for leg swelling.  Gastrointestinal: Negative for nausea, vomiting, abdominal pain, diarrhea and blood in stool.  Genitourinary: Negative for frequency, hematuria, flank pain and difficulty urinating.  Musculoskeletal: Negative for arthralgias and back pain.  Skin: Negative for rash.  Neurological: Positive for light-headedness. Negative for dizziness, speech difficulty, weakness, numbness and headaches.      Allergies  Review of patient's allergies indicates no known allergies.  Home Medications   Prior to Admission medications   Medication Sig Start Date End Date  Taking? Authorizing Provider  aspirin EC 81 MG tablet Take 81 mg by mouth every morning.   Yes Historical Provider, MD  atorvastatin (LIPITOR) 80 MG tablet Take 1 tablet (80 mg total) by mouth daily at 6 PM. 10/21/13  Yes Minus Breeding, MD  CALCIUM CARBONATE-VITAMIN D PO Take by mouth every morning.   Yes Historical Provider, MD  clopidogrel (PLAVIX) 75 MG tablet Take 75 mg by mouth daily.   Yes Historical Provider, MD  famotidine (PEPCID) 20 MG tablet Take 1 tablet (20 mg total) by mouth 2 (two) times daily as needed for  heartburn. 10/21/12  Yes Burtis Junes, NP  hydrochlorothiazide (HYDRODIURIL) 25 MG tablet Take 25 mg by mouth every morning.   Yes Historical Provider, MD  metoprolol tartrate (LOPRESSOR) 25 MG tablet Take 25 mg by mouth 2 (two) times daily.   Yes Historical Provider, MD  Multiple Vitamin (MULTIVITAMIN WITH MINERALS) TABS tablet Take 1 tablet by mouth every morning.   Yes Historical Provider, MD  nitroGLYCERIN (NITROSTAT) 0.4 MG SL tablet Place 1 tablet (0.4 mg total) under the tongue every 5 (five) minutes x 3 doses as needed for chest pain. 10/01/12  Yes Rogelia Mire, NP  potassium chloride SA (KLOR-CON M20) 20 MEQ tablet Take 1 tablet (20 mEq total) by mouth daily. 10/21/13  Yes Minus Breeding, MD   BP 130/52  Pulse 65  Temp(Src) 97.9 F (36.6 C) (Oral)  Resp 18  Ht 5\' 3"  (1.6 m)  Wt 164 lb 6.4 oz (74.571 kg)  BMI 29.13 kg/m2  SpO2 100% Physical Exam  Constitutional: She is oriented to person, place, and time. She appears well-developed and well-nourished.  HENT:  Head: Normocephalic and atraumatic.  Eyes: Pupils are equal, round, and reactive to light.  Neck: Normal range of motion. Neck supple.  Cardiovascular: Normal rate, regular rhythm and normal heart sounds.   Pulmonary/Chest: Effort normal and breath sounds normal. No respiratory distress. She has no wheezes. She has no rales. She exhibits no tenderness.  Abdominal: Soft. Bowel sounds are normal. There is no tenderness. There is no rebound and no guarding.  Musculoskeletal: Normal range of motion. She exhibits edema (trace non-pitting edema bilaterally).  No calf tenderness  Lymphadenopathy:    She has no cervical adenopathy.  Neurological: She is alert and oriented to person, place, and time.  Skin: Skin is warm and dry. No rash noted.  Psychiatric: She has a normal mood and affect.    ED Course  Procedures (including critical care time) Labs Review Labs Reviewed  BASIC METABOLIC PANEL - Abnormal; Notable for  the following:    Glucose, Bld 142 (*)    GFR calc non Af Amer 56 (*)    GFR calc Af Amer 65 (*)    All other components within normal limits  PRO B NATRIURETIC PEPTIDE  CBC WITH DIFFERENTIAL  TROPONIN I  HEPARIN LEVEL (UNFRACTIONATED)  CBC  TROPONIN I  TROPONIN I  BASIC METABOLIC PANEL  LIPID PANEL  I-STAT TROPOININ, ED    Imaging Review Dg Chest 2 View  01/09/2014   CLINICAL DATA:  Chest pain  EXAM: CHEST  2 VIEW  COMPARISON:  09/29/2012  FINDINGS: Mild hyperinflation with changes of COPD. Improvement in bibasilar atelectasis since prior study. Negative for heart failure or pneumonia. No mass or effusion.  IMPRESSION: No active cardiopulmonary disease.   Electronically Signed   By: Franchot Gallo M.D.   On: 01/09/2014 12:36     EKG Interpretation  Date/Time:  Friday January 09 2014 11:49:08 EDT Ventricular Rate:  65 PR Interval:  184 QRS Duration: 82 QT Interval:  388 QTC Calculation: 403 R Axis:   19 Text Interpretation:  Normal sinus rhythm Low voltage QRS Cannot rule out  Anterior infarct , age undetermined Abnormal ECG since last tracing no  significant change Confirmed by Drucilla Cumber  MD, Madalyne Husk ([id number]) on 01/09/2014  4:06:58 PM      MDM   Final diagnoses:  Intermediate coronary syndrome    Patient is pain free and ED currently. She was given aspirin. I consulted with the hospitalist on call who will admit the patient for further cardiac evaluation.    Malvin Johns, MD 01/10/14 3159  Malvin Johns, MD 01/10/14 4585

## 2014-01-09 NOTE — ED Notes (Addendum)
The patient said she started having chest pain and was light headed since 1000.  The patient denies any other symptoms other than being lightheaded.  She says she waited for her friend to come get her.  She is here to be evaluated.  She did have stents placed July 2014.  She also said she did take one 81mg  aspirin.

## 2014-01-09 NOTE — ED Notes (Signed)
Attempted report x1. 

## 2014-01-09 NOTE — H&P (Signed)
Patient ID: Nancy Wise MRN: [id number], DOB/AGE: [birth year]   Admit date: 01/09/2014   Primary Physician: Criselda Peaches, MD Primary Cardiologist: Dr. Percival Spanish  Pt. Profile:  80 year old Caucasian female with past medical history significant for hypertension, hyperlipidemia, and history of coronary artery disease status post stent to LAD June 2014 presented with CP  Problem List  Past Medical History  Diagnosis Date  . Hypertension   . Bladder retention   . Melanoma of foot   . Basal cell carcinoma of nose   . Coronary artery disease     a. 09/2012 NSTEMI/Cath/PCI: LM nl, LAD 80-71m (2.25x16 Promus Premier DES), LCX nl, RCA 30-40 diff in PL, 30 PDA, EF 55-60%.  . Obesity     Past Surgical History  Procedure Laterality Date  . Tonsillectomy  1930's  . Cesarean section  1968  . Coronary angioplasty with stent placement  09/30/2012    "1" (09/30/2012)  . Melanoma excision Left 2013    'foot" (09/30/2012)  . Basal cell carcinoma excision Right 2011?    "nose" (09/30/2012)     Allergies  No Known Allergies  HPI  The patient is an 80 year old Caucasian female with past medical history significant for hypertension, hyperlipidemia, and history of coronary artery disease status post stent to LAD June 2014. According to our record, patient presented to [facility] hospital in June 2014 with NSTEMI. Cardiac catheterization performed on 09/30/2012 showed focal 80-90% mid LAD stenosis treated with drug-eluting stent, diffuse 30-40% residual stenosis posterior lateral branch, 30% disease in distal RCA, EF 55-60%. Since her stent placement, patient has been doing well at home. She states she has not been very active however she does go to group [employer] meeting to do stretching exercises with other people. The last time she was at [employer] was 3 weeks ago. She denies any strenuous exercise during her [place]. Her last followup with Dr. Percival Spanish in the clinic  was on 08/04/2013, at which time she was doing well without significant chest discomfort or shortness breath. According to the patient, she continued to take aspirin and Plavix along with beta blocker and statin medication. She denies any recent fever, chill, lower extremity edema, orthopnea or paroxysmal nocturnal dyspnea.  Patient woke up around 10 AM in the morning of 01/09/2014, she did not feel very well after waking up and felt very dizzy this morning. While trying to get up this morning and walk around in her house, she felt mid substernal chest pressure similar to what she experienced last June. She denies any exacerbating or alleviating factors including deep inspiration or body positional changes. The chest pain lasted about 2 hours and eventually resolved around 12 PM. Due to the similarity with the previous MI, patient sought medical attention at [facility] ED. On arrival to [facility] ED, her blood pressure was 125/58. O2 saturation 95% on room air. Heart rate 63. Her systolic blood pressure would sometime dip down to the 90s. Her labs were essentially negative with creatinine of 0.90, negative troponin, proBNP of 145. Chest x-ray was negative. EKG showed normal sinus rhythm with poor R wave progression anteriorly, otherwise no significant ST-T wave changes.  Home Medications  Prior to Admission medications   Medication Sig Start Date End Date Taking? Authorizing Provider  aspirin EC 81 MG tablet Take 81 mg by mouth every morning.   Yes Historical Provider, MD  atorvastatin (LIPITOR) 80 MG tablet Take 1 tablet (80 mg total) by mouth  daily at 6 PM. 10/21/13  Yes Minus Breeding, MD  CALCIUM CARBONATE-VITAMIN D PO Take by mouth every morning.   Yes Historical Provider, MD  clopidogrel (PLAVIX) 75 MG tablet Take 75 mg by mouth daily.   Yes Historical Provider, MD  famotidine (PEPCID) 20 MG tablet Take 1 tablet (20 mg total) by mouth 2 (two) times daily as needed for heartburn. 10/21/12  Yes Burtis Junes, NP  hydrochlorothiazide (HYDRODIURIL) 25 MG tablet Take 25 mg by mouth every morning.   Yes Historical Provider, MD  metoprolol tartrate (LOPRESSOR) 25 MG tablet Take 25 mg by mouth 2 (two) times daily.   Yes Historical Provider, MD  Multiple Vitamin (MULTIVITAMIN WITH MINERALS) TABS tablet Take 1 tablet by mouth every morning.   Yes Historical Provider, MD  nitroGLYCERIN (NITROSTAT) 0.4 MG SL tablet Place 1 tablet (0.4 mg total) under the tongue every 5 (five) minutes x 3 doses as needed for chest pain. 10/01/12  Yes Rogelia Mire, NP  potassium chloride SA (KLOR-CON M20) 20 MEQ tablet Take 1 tablet (20 mEq total) by mouth daily. 10/21/13  Yes Minus Breeding, MD    Family History  Family History  Problem Relation Age of Onset  . Cancer Sister 49    Lung    Social History  History   Social History  . Marital Status: Widowed    Spouse Name: N/A    Number of Children: 74  . Years of Education: N/A   Occupational History  . Not on file.   Social History Main Topics  . Smoking status: Never Smoker   . Smokeless tobacco: Never Used  . Alcohol Use: No  . Drug Use: No  . Sexual Activity: No   Other Topics Concern  . Not on file   Social History Narrative   Lives at home with son.       Review of Systems General:  No chills, fever, night sweats or weight changes.  Cardiovascular:  No dyspnea on exertion, edema, orthopnea, palpitations, paroxysmal nocturnal dyspnea. +chest pain Dermatological: No rash, lesions/masses Respiratory: No cough, dyspnea Urologic: No hematuria, dysuria Abdominal:   No nausea, vomiting, diarrhea, bright red blood per rectum, melena, or hematemesis Neurologic:  No visual changes, wkns, changes in mental status. All other systems reviewed and are otherwise negative except as noted above.  Physical Exam  Blood pressure 125/58, pulse 63, temperature 98.5 F (36.9 C), temperature source Oral, resp. rate 18, SpO2 95.00%.  General:  Pleasant, NAD Psych: Normal affect. Neuro: Alert and oriented X 3. Moves all extremities spontaneously. HEENT: Normal  Neck: Supple without bruits or JVD. Lungs:  Resp regular and unlabored, decreased breath sound, however no obvious rale, rhonchi or wheezing Heart: RRR no s3, s4, or murmurs. Abdomen: Soft, non-tender, non-distended, BS + x 4.  Extremities: No clubbing, cyanosis or edema. DP/PT/Radials 2+ and equal bilaterally.  Labs  Troponin [facility] of Care Test)  Recent Labs  01/09/14 1207  TROPIPOC 0.00   No results found for this basename: CKTOTAL, CKMB, TROPONINI,  in the last 72 hours Lab Results  Component Value Date   WBC 6.2 01/09/2014   HGB 13.7 01/09/2014   HCT 42.7 01/09/2014   MCV 84.7 01/09/2014   PLT 175 01/09/2014    Recent Labs Lab 01/09/14 1203  NA 143  K 3.8  CL 101  CO2 31  BUN 19  CREATININE 0.92  CALCIUM 9.3  GLUCOSE 142*   Lab Results  Component Value Date  CHOL 183 09/30/2012   HDL 63 09/30/2012   LDLCALC 109* 09/30/2012   TRIG 56 09/30/2012   Lab Results  Component Value Date   DDIMER 0.89* 10/04/2012     Radiology/Studies  Dg Chest 2 View  01/09/2014   CLINICAL DATA:  Chest pain  EXAM: CHEST  2 VIEW  COMPARISON:  09/29/2012  FINDINGS: Mild hyperinflation with changes of COPD. Improvement in bibasilar atelectasis since prior study. Negative for heart failure or pneumonia. No mass or effusion.  IMPRESSION: No active cardiopulmonary disease.   Electronically Signed   By: Franchot Gallo M.D.   On: 01/09/2014 12:36    ECG  EKG showed normal sinus rhythm with poor R wave progression anteriorly, otherwise no significant ST-T wave changes.   ASSESSMENT AND PLAN  1. Chest pain concerning for unstable angina  - admit to obs, trend troponin overnight  - cath on monday  2. CAD  - Cath 09/30/2012 showed focal 80-90% mid LAD stenosis treated with drug-eluting stent, diffuse 30-40% residual stenosis posterior lateral branch, 30% disease in distal RCA,  EF 55-60%   3. HTN 4. Hyperlepidemia    Signed, Almyra Deforest, PA-C 01/09/2014, 5:00 PM As above, patient seen and examined. Briefly she is an 80 year old female with a past medical history of coronary artery disease status post PCI of the LAD in June of 2014, hypertension, hyperlipidemia with unstable angina. The patient denies any chest pain or dyspnea since her previous intervention. this a.m. She awoke with dizziness followed by substernal chest tightness similar to her infarct pain. Her symptoms were not pleuritic, positional and did not radiate. No associated symptoms. Duration 2 hours and resolved spontaneously. Presently pain free. Electrocardiogram shows sinus rhythm with nonspecific ST changes. Symptoms are concerning given description similar to previous infarct. Plan aspirin, beta blocker, statin and heparin. Cycle enzymes. Proceed with cardiac catheterization on Monday. The risks and benefits were discussed and the patient agrees to proceed. Kirk Ruths

## 2014-01-10 DIAGNOSIS — I2510 Atherosclerotic heart disease of native coronary artery without angina pectoris: Secondary | ICD-10-CM

## 2014-01-10 LAB — CBC
HCT: 38.2 % (ref 36.0–46.0)
Hemoglobin: 12.1 g/dL (ref 12.0–15.0)
MCH: 26.8 pg (ref 26.0–34.0)
MCHC: 31.7 g/dL (ref 30.0–36.0)
MCV: 84.7 fL (ref 78.0–100.0)
PLATELETS: 154 10*3/uL (ref 150–400)
RBC: 4.51 MIL/uL (ref 3.87–5.11)
RDW: 14 % (ref 11.5–15.5)
WBC: 6.6 10*3/uL (ref 4.0–10.5)

## 2014-01-10 LAB — HEPARIN LEVEL (UNFRACTIONATED)
HEPARIN UNFRACTIONATED: 0.9 [IU]/mL — AB (ref 0.30–0.70)
Heparin Unfractionated: 0.83 IU/mL — ABNORMAL HIGH (ref 0.30–0.70)
Heparin Unfractionated: 0.57 IU/mL (ref 0.30–0.70)

## 2014-01-10 LAB — LIPID PANEL
Cholesterol: 117 mg/dL (ref 0–200)
HDL: 62 mg/dL (ref >39)
LDL CALC: 49 mg/dL (ref 0–99)
TRIGLYCERIDES: 32 mg/dL (ref <150)
Total CHOL/HDL Ratio: 1.9 RATIO
VLDL: 6 mg/dL (ref 0–40)

## 2014-01-10 LAB — BASIC METABOLIC PANEL
Anion gap: 10 (ref 5–15)
BUN: 23 mg/dL (ref 6–23)
CALCIUM: 8.7 mg/dL (ref 8.4–10.5)
CO2: 28 meq/L (ref 19–32)
Chloride: 106 mEq/L (ref 96–112)
Creatinine, Ser: 1.03 mg/dL (ref 0.50–1.10)
GFR calc Af Amer: 57 mL/min — ABNORMAL LOW (ref >90)
GFR, EST NON AFRICAN AMERICAN: 49 mL/min — AB (ref >90)
GLUCOSE: 94 mg/dL (ref 70–99)
Potassium: 4 mEq/L (ref 3.7–5.3)
Sodium: 144 mEq/L (ref 137–147)

## 2014-01-10 LAB — TROPONIN I
Troponin I: <0.3 ng/mL (ref <0.30)
Troponin I: <0.3 ng/mL (ref <0.30)

## 2014-01-10 NOTE — Progress Notes (Signed)
ANTICOAGULATION CONSULT NOTE - Follow Up Consult  Pharmacy Consult for Heparin  Indication: chest pain/ACS  No Known Allergies  Patient Measurements: Height: 5\' 3"  (160 cm) Weight: 164 lb 6.4 oz (74.571 kg) IBW/kg (Calculated) : 52.4  Vital Signs: Temp: 97.9 F (36.6 C) (09/18 2100) BP: 130/52 mmHg (09/18 2100) Pulse Rate: 65 (09/18 2100)  Labs:  Recent Labs  01/09/14 1203 01/09/14 2143 01/10/14 0300  HGB 13.7  --  12.1  HCT 42.7  --  38.2  PLT 175  --  154  HEPARINUNFRC  --   --  0.83*  CREATININE 0.92  --  1.03  TROPONINI  --  <0.30 <0.30    Estimated Creatinine Clearance: 40.8 ml/min (by C-G formula based on Cr of 1.03).   Assessment: 80 y/o F with slightly elevated heparin level, other labs as above, no issues per RN.   Goal of Therapy:  Heparin level 0.3-0.7 units/ml Monitor platelets by anticoagulation protocol: Yes   Plan:  -Decrease heparin drip to 750 units/hr -1300 HL -Daily CBC/HL -Monitor for bleeding   Narda Bonds 01/10/2014,4:44 AM

## 2014-01-10 NOTE — Progress Notes (Signed)
ANTICOAGULATION CONSULT NOTE - Follow Up Consult  Pharmacy Consult for Heparin  Indication: chest pain/ACS  No Known Allergies  Patient Measurements: Height: 5\' 3"  (160 cm) Weight: 164 lb 6.4 oz (74.571 kg) IBW/kg (Calculated) : 52.4  Vital Signs: Temp: 98.5 F (36.9 C) (09/19 2100) Temp src: Oral (09/19 1451) BP: 114/41 mmHg (09/19 2100) Pulse Rate: 56 (09/19 2100)  Labs:  Recent Labs  01/09/14 1203 01/09/14 2143 01/10/14 0300 01/10/14 0755 01/10/14 1236 01/10/14 2220  HGB 13.7  --  12.1  --   --   --   HCT 42.7  --  38.2  --   --   --   PLT 175  --  154  --   --   --   HEPARINUNFRC  --   --  0.83*  --  0.90* 0.57  CREATININE 0.92  --  1.03  --   --   --   TROPONINI  --  <0.30 <0.30 <0.30  --   --     Estimated Creatinine Clearance: 40.8 ml/min (by C-G formula based on Cr of 1.03).   Assessment: 80 y/o F with therapeutic heparin level, other labs as above.   Goal of Therapy:  Heparin level 0.3-0.7 units/ml Monitor platelets by anticoagulation protocol: Yes   Plan:  -Continue heparin at 600 units/hr -AM HL -Daily CBC/HL -Monitor for bleeding   Narda Bonds 01/10/2014,10:55 PM

## 2014-01-10 NOTE — Progress Notes (Signed)
ANTICOAGULATION CONSULT NOTE - Follow Up Consult  Pharmacy Consult for Heparin  Indication: chest pain/ACS  No Known Allergies  Patient Measurements: Height: 5\' 3"  (160 cm) Weight: 164 lb 6.4 oz (74.571 kg) IBW/kg (Calculated) : 52.4 Heparin dosing wt: 68.2kg  Vital Signs: Temp: 98.6 F (37 C) (09/19 0500) BP: 109/61 mmHg (09/19 0923) Pulse Rate: 64 (09/19 0923)  Recent Labs  01/09/14 1203 01/09/14 2143 01/10/14 0300 01/10/14 0755 01/10/14 1236  HGB 13.7  --  12.1  --   --   HCT 42.7  --  38.2  --   --   PLT 175  --  154  --   --   HEPARINUNFRC  --   --  0.83*  --  0.90*  CREATININE 0.92  --  1.03  --   --   TROPONINI  --  <0.30 <0.30 <0.30  --     Estimated Creatinine Clearance: 40.8 ml/min (by C-G formula based on Cr of 1.03).   Assessment: 80 y/o F with slightly elevated heparin level at 0.9, other labs as above, no bleeding noted.  Goal of Therapy:  Heparin level 0.3-0.7 units/ml Monitor platelets by anticoagulation protocol: Yes   Plan:  -Decrease heparin drip to 600 units/hr -2200 HL -Daily CBC/HL -Monitor for bleeding -Cath planned 0730 on Monday 9/21  Drucie Opitz, PharmD Clinical Pharmacy Resident Pager: [phone] 01/10/2014 1:32 PM

## 2014-01-10 NOTE — Progress Notes (Signed)
Patient Name: Nancy Wise Date of Encounter: 01/10/2014  Active Problems:   Intermediate coronary syndrome   Unstable angina   Length of Stay: 1  SUBJECTIVE  No further angina. No dyspnea. Enzymes normal  CURRENT MEDS . [START ON 01/12/2014] aspirin  81 mg Oral Pre-Cath  . aspirin EC  81 mg Oral q morning - 10a  . atorvastatin  80 mg Oral q1800  . calcium-vitamin D  1 tablet Oral Q breakfast  . clopidogrel  75 mg Oral Daily  . hydrochlorothiazide  25 mg Oral q morning - 10a  . metoprolol tartrate  25 mg Oral BID  . multivitamin with minerals  1 tablet Oral q morning - 10a  . potassium chloride SA  20 mEq Oral Daily  . sodium chloride  3 mL Intravenous Q12H    OBJECTIVE   Intake/Output Summary (Last 24 hours) at 01/10/14 1108 Last data filed at 01/10/14 0753  Gross per 24 hour  Intake    100 ml  Output      0 ml  Net    100 ml   Filed Weights   01/09/14 2100  Weight: 74.571 kg (164 lb 6.4 oz)    PHYSICAL EXAM Filed Vitals:   01/09/14 1830 01/09/14 2100 01/10/14 0500 01/10/14 0923  BP: 108/73 130/52 99/55 109/61  Pulse: 58 65 53 64  Temp:  97.9 F (36.6 C) 98.6 F (37 C)   TempSrc:      Resp: 16 18 18    Height:  5\' 3"  (1.6 m)    Weight:  74.571 kg (164 lb 6.4 oz)    SpO2: 100% 100% 98% 100%   General: Alert, oriented x3, no distress Head: no evidence of trauma, PERRL, EOMI, no exophtalmos or lid lag, no myxedema, no xanthelasma; normal ears, nose and oropharynx Neck: normal jugular venous pulsations and no hepatojugular reflux; brisk carotid pulses without delay and no carotid bruits Chest: clear to auscultation, no signs of consolidation by percussion or palpation, normal fremitus, symmetrical and full respiratory excursions Cardiovascular: normal position and quality of the apical impulse, regular rhythm, normal first and second heart sounds, no rubs or gallops, no murmur Abdomen: no tenderness or distention, no masses by palpation, no abnormal  pulsatility or arterial bruits, normal bowel sounds, no hepatosplenomegaly Extremities: no clubbing, cyanosis or edema; 2+ radial, ulnar and brachial pulses bilaterally; 2+ right femoral, posterior tibial and dorsalis pedis pulses; 2+ left femoral, posterior tibial and dorsalis pedis pulses; no subclavian or femoral bruits Neurological: grossly nonfocal  LABS  CBC  Recent Labs  01/09/14 1203 01/10/14 0300  WBC 6.2 6.6  NEUTROABS 4.1  --   HGB 13.7 12.1  HCT 42.7 38.2  MCV 84.7 84.7  PLT 175 037   Basic Metabolic Panel  Recent Labs  01/09/14 1203 01/10/14 0300  NA 143 144  K 3.8 4.0  CL 101 106  CO2 31 28  GLUCOSE 142* 94  BUN 19 23  CREATININE 0.92 1.03  CALCIUM 9.3 8.7   Liver Function Tests No results found for this basename: AST, ALT, ALKPHOS, BILITOT, PROT, ALBUMIN,  in the last 72 hours No results found for this basename: LIPASE, AMYLASE,  in the last 72 hours Cardiac Enzymes  Recent Labs  01/09/14 2143 01/10/14 0300 01/10/14 0755  TROPONINI <0.30 <0.30 <0.30   BNP No components found with this basename: POCBNP,  D-Dimer No results found for this basename: DDIMER,  in the last 72 hours Hemoglobin A1C No results found for  this basename: HGBA1C,  in the last 72 hours Fasting Lipid Panel  Recent Labs  01/10/14 0300  CHOL 117  HDL 62  LDLCALC 49  TRIG 32  CHOLHDL 1.9   Thyroid Function Tests No results found for this basename: TSH, T4TOTAL, FREET3, T3FREE, THYROIDAB,  in the last 72 hours  Radiology Studies Imaging results have been reviewed and Dg Chest 2 View  01/09/2014   CLINICAL DATA:  Chest pain  EXAM: CHEST  2 VIEW  COMPARISON:  09/29/2012  FINDINGS: Mild hyperinflation with changes of COPD. Improvement in bibasilar atelectasis since prior study. Negative for heart failure or pneumonia. No mass or effusion.  IMPRESSION: No active cardiopulmonary disease.   Electronically Signed   By: Franchot Gallo M.D.   On: 01/09/2014 12:36     TELE NSR  ECG NSR, no St changes  ASSESSMENT AND PLAN For cath 0730h on Monday, Dr. Pernell Dupre. This procedure has been fully reviewed with the patient and informed consent has been obtained.  Sanda Klein, MD, [facility] [phone] office [phone] pager 01/10/2014 11:08 AM

## 2014-01-11 LAB — CBC
HEMATOCRIT: 37.3 % (ref 36.0–46.0)
HEMOGLOBIN: 12.4 g/dL (ref 12.0–15.0)
MCH: 27.3 pg (ref 26.0–34.0)
MCHC: 33.2 g/dL (ref 30.0–36.0)
MCV: 82 fL (ref 78.0–100.0)
Platelets: 149 10*3/uL — ABNORMAL LOW (ref 150–400)
RBC: 4.55 MIL/uL (ref 3.87–5.11)
RDW: 14 % (ref 11.5–15.5)
WBC: 6.1 10*3/uL (ref 4.0–10.5)

## 2014-01-11 LAB — HEPARIN LEVEL (UNFRACTIONATED): HEPARIN UNFRACTIONATED: 0.43 [IU]/mL (ref 0.30–0.70)

## 2014-01-11 MED ORDER — SODIUM CHLORIDE 0.9 % IV SOLN: 1.0000 mL/kg/h | INTRAVENOUS | Status: DC

## 2014-01-11 MED ORDER — SODIUM CHLORIDE 0.9 % IJ SOLN: 3.0000 mL | INTRAMUSCULAR | Status: DC | PRN

## 2014-01-11 MED ORDER — ASPIRIN 81 MG PO CHEW
81.0000 mg | CHEWABLE_TABLET | ORAL | Status: AC
  Administered 2014-01-12: 81 mg via ORAL
  Filled 2014-01-11: qty 1

## 2014-01-11 MED ORDER — SODIUM CHLORIDE 0.9 % IV SOLN: 250.0000 mL | INTRAVENOUS | Status: DC | PRN

## 2014-01-11 MED ORDER — SODIUM CHLORIDE 0.9 % IJ SOLN: 3.0000 mL | Freq: Two times a day (BID) | INTRAMUSCULAR | Status: DC

## 2014-01-11 NOTE — Progress Notes (Signed)
Patient Name: Nancy Wise Date of Encounter: 01/11/2014  Active Problems:   Intermediate coronary syndrome   Unstable angina   Length of Stay: 2  SUBJECTIVE  No angina overnight and no new complaints.  CURRENT MEDS . [START ON 01/12/2014] aspirin  81 mg Oral Pre-Cath  . aspirin EC  81 mg Oral q morning - 10a  . atorvastatin  80 mg Oral q1800  . calcium-vitamin D  1 tablet Oral Q breakfast  . clopidogrel  75 mg Oral Daily  . hydrochlorothiazide  25 mg Oral q morning - 10a  . metoprolol tartrate  25 mg Oral BID  . multivitamin with minerals  1 tablet Oral q morning - 10a  . potassium chloride SA  20 mEq Oral Daily  . sodium chloride  3 mL Intravenous Q12H    OBJECTIVE   Intake/Output Summary (Last 24 hours) at 01/11/14 0946 Last data filed at 01/11/14 0919  Gross per 24 hour  Intake    100 ml  Output      0 ml  Net    100 ml   Filed Weights   01/09/14 2100  Weight: 74.571 kg (164 lb 6.4 oz)    PHYSICAL EXAM Filed Vitals:   01/10/14 0923 01/10/14 1451 01/10/14 2100 01/11/14 0500  BP: 109/61 112/61 114/41 109/47  Pulse: 64 56 56 56  Temp:  98.1 F (36.7 C) 98.5 F (36.9 C) 98 F (36.7 C)  TempSrc:  Oral    Resp:   16 16  Height:      Weight:      SpO2: 100% 96% 97% 92%   General: Alert, oriented x3, no distress  Head: no evidence of trauma, PERRL, EOMI, no exophtalmos or lid lag, no myxedema, no xanthelasma; normal ears, nose and oropharynx  Neck: normal jugular venous pulsations and no hepatojugular reflux; brisk carotid pulses without delay and no carotid bruits  Chest: clear to auscultation, no signs of consolidation by percussion or palpation, normal fremitus, symmetrical and full respiratory excursions  Cardiovascular: normal position and quality of the apical impulse, regular rhythm, normal first and second heart sounds, no rubs or gallops, no murmur  Abdomen: no tenderness or distention, no masses by palpation, no abnormal pulsatility or  arterial bruits, normal bowel sounds, no hepatosplenomegaly  Extremities: no clubbing, cyanosis or edema; 2+ radial, ulnar and brachial pulses bilaterally; 2+ right femoral, posterior tibial and dorsalis pedis pulses; 2+ left femoral, posterior tibial and dorsalis pedis pulses; no subclavian or femoral bruits  Neurological: grossly nonfocal   LABS  CBC  Recent Labs  01/09/14 1203 01/10/14 0300 01/11/14 0340  WBC 6.2 6.6 6.1  NEUTROABS 4.1  --   --   HGB 13.7 12.1 12.4  HCT 42.7 38.2 37.3  MCV 84.7 84.7 82.0  PLT 175 154 694*   Basic Metabolic Panel  Recent Labs  01/09/14 1203 01/10/14 0300  NA 143 144  K 3.8 4.0  CL 101 106  CO2 31 28  GLUCOSE 142* 94  BUN 19 23  CREATININE 0.92 1.03  CALCIUM 9.3 8.7   Liver Function Tests No results found for this basename: AST, ALT, ALKPHOS, BILITOT, PROT, ALBUMIN,  in the last 72 hours No results found for this basename: LIPASE, AMYLASE,  in the last 72 hours Cardiac Enzymes  Recent Labs  01/09/14 2143 01/10/14 0300 01/10/14 0755  TROPONINI <0.30 <0.30 <0.30   BNP No components found with this basename: POCBNP,  D-Dimer No results found for this basename:  DDIMER,  in the last 72 hours Hemoglobin A1C No results found for this basename: HGBA1C,  in the last 72 hours Fasting Lipid Panel  Recent Labs  01/10/14 0300  CHOL 117  HDL 62  LDLCALC 49  TRIG 32  CHOLHDL 1.9   Thyroid Function Tests No results found for this basename: TSH, T4TOTAL, FREET3, T3FREE, THYROIDAB,  in the last 72 hours  Radiology Studies Imaging results have been reviewed and Dg Chest 2 View  01/09/2014   CLINICAL DATA:  Chest pain  EXAM: CHEST  2 VIEW  COMPARISON:  09/29/2012  FINDINGS: Mild hyperinflation with changes of COPD. Improvement in bibasilar atelectasis since prior study. Negative for heart failure or pneumonia. No mass or effusion.  IMPRESSION: No active cardiopulmonary disease.   Electronically Signed   By: Franchot Gallo M.D.   On:  01/09/2014 12:36    TELE NSR  ASSESSMENT AND PLAN For diagnostic cath +/- PCI in AM, for suspected unstable angina pectoris. Scheduled at 0730h.   Sanda Klein, MD, [facility] [phone] office [phone] pager 01/11/2014 9:46 AM

## 2014-01-11 NOTE — Progress Notes (Signed)
ANTICOAGULATION CONSULT NOTE - Follow Up Consult  Pharmacy Consult for Heparin  Indication: chest pain/ACS  No Known Allergies  Patient Measurements: Height: 5\' 3"  (160 cm) Weight: 164 lb 6.4 oz (74.571 kg) IBW/kg (Calculated) : 52.4 Heparin dosing wt: 68.2kg  Vital Signs: Temp: 97.5 F (36.4 C) (09/20 1344) Temp src: Oral (09/20 1344) BP: 110/49 mmHg (09/20 1344) Pulse Rate: 63 (09/20 1344)  Recent Labs  01/09/14 1203 01/09/14 2143  01/10/14 0300 01/10/14 0755 01/10/14 1236 01/10/14 2220 01/11/14 0340  HGB 13.7  --   --  12.1  --   --   --  12.4  HCT 42.7  --   --  38.2  --   --   --  37.3  PLT 175  --   --  154  --   --   --  149*  HEPARINUNFRC  --   --   < > 0.83*  --  0.90* 0.57 0.43  CREATININE 0.92  --   --  1.03  --   --   --   --   TROPONINI  --  <0.30  --  <0.30 <0.30  --   --   --   < > = values in this interval not displayed.  Estimated Creatinine Clearance: 40.8 ml/min (by C-G formula based on Cr of 1.03).   Assessment: 80 y/o F with therapeutic HL x2, other labs as above, no bleeding noted.  Goal of Therapy:  Heparin level 0.3-0.7 units/ml Monitor platelets by anticoagulation protocol: Yes   Plan:  -Continue heparin gtt 600 units/hr -Daily CBC/HL -Monitor for bleeding -Cath planned 0730 on Monday 9/21  Drucie Opitz, PharmD Clinical Pharmacy Resident Pager: [phone] 01/11/2014 2:32 PM

## 2014-01-12 ENCOUNTER — Encounter (HOSPITAL_COMMUNITY): Payer: Self-pay | Admitting: Physician Assistant

## 2014-01-12 ENCOUNTER — Encounter (HOSPITAL_COMMUNITY)
Admission: EM | Disposition: A | Discharge status: Home / Self Care | Payer: Self-pay | Source: Home / Self Care | Attending: Cardiology

## 2014-01-12 HISTORY — PX: LEFT HEART CATHETERIZATION WITH CORONARY ANGIOGRAM: SHX5451

## 2014-01-12 LAB — CBC
HEMATOCRIT: 38.4 % (ref 36.0–46.0)
Hemoglobin: 12.4 g/dL (ref 12.0–15.0)
MCH: 26.7 pg (ref 26.0–34.0)
MCHC: 32.3 g/dL (ref 30.0–36.0)
MCV: 82.6 fL (ref 78.0–100.0)
PLATELETS: 158 10*3/uL (ref 150–400)
RBC: 4.65 MIL/uL (ref 3.87–5.11)
RDW: 14.2 % (ref 11.5–15.5)
WBC: 6 10*3/uL (ref 4.0–10.5)

## 2014-01-12 LAB — PROTIME-INR
INR: 1.07 (ref 0.00–1.49)
PROTHROMBIN TIME: 13.9 s (ref 11.6–15.2)

## 2014-01-12 SURGERY — LEFT HEART CATHETERIZATION WITH CORONARY ANGIOGRAM
Anesthesia: LOCAL

## 2014-01-12 MED ORDER — NITROGLYCERIN 1 MG/10 ML FOR IR/CATH LAB
INTRA_ARTERIAL | Status: AC
  Filled 2014-01-12: qty 10

## 2014-01-12 MED ORDER — MIDAZOLAM HCL 2 MG/2ML IJ SOLN
INTRAMUSCULAR | Status: AC
  Filled 2014-01-12: qty 2

## 2014-01-12 MED ORDER — FENTANYL CITRATE 0.05 MG/ML IJ SOLN
INTRAMUSCULAR | Status: AC
  Filled 2014-01-12: qty 2

## 2014-01-12 MED ORDER — VERAPAMIL HCL 2.5 MG/ML IV SOLN
INTRAVENOUS | Status: AC
  Filled 2014-01-12: qty 2

## 2014-01-12 MED ORDER — HEPARIN (PORCINE) IN NACL 2-0.9 UNIT/ML-% IJ SOLN
INTRAMUSCULAR | Status: AC
  Filled 2014-01-12: qty 500

## 2014-01-12 MED ORDER — OXYCODONE-ACETAMINOPHEN 5-325 MG PO TABS
1.0000 | ORAL_TABLET | ORAL | Status: DC | PRN
Start: 2014-01-12 — End: 2014-01-12

## 2014-01-12 MED ORDER — SODIUM CHLORIDE 0.9 % IV SOLN: INTRAVENOUS | Status: AC

## 2014-01-12 MED ORDER — HEPARIN SODIUM (PORCINE) 1000 UNIT/ML IJ SOLN
INTRAMUSCULAR | Status: AC
  Filled 2014-01-12: qty 1

## 2014-01-12 MED ORDER — HEPARIN (PORCINE) IN NACL 2-0.9 UNIT/ML-% IJ SOLN
INTRAMUSCULAR | Status: AC
  Filled 2014-01-12: qty 1000

## 2014-01-12 MED ORDER — LIDOCAINE HCL (PF) 1 % IJ SOLN
INTRAMUSCULAR | Status: AC
  Filled 2014-01-12: qty 30

## 2014-01-12 NOTE — Discharge Summary (Signed)
Patient seen and examined and history reviewed. Agree with above findings and plan. See earlier rounding note.  Nancy Wise, [place] 01/12/2014 2:01 PM

## 2014-01-12 NOTE — Discharge Summary (Signed)
Discharge Summary   Patient ID: Nancy Wise,  MRN: [id number], DOB/AGE: 01-Dec-1929 80 y.o.  Admit date: 01/09/2014 Discharge date: 01/12/2014  Primary Care Provider: Criselda Peaches Primary Cardiologist: Dr. Percival Spanish  Discharge Diagnoses Principal Problem:   Chest pain  - recath 01/12/2014 patent LAD stent, normal coronary. CP noncardiac, medical management  Active Problems:   CAD (coronary artery disease)  - Cath 09/30/2012 showed focal 80-90% mid LAD stenosis treated with drug-eluting stent, diffuse 30-40% residual stenosis posterior lateral branch, 30% disease in distal RCA, EF 55-60%   - recath 01/12/2014 patent LAD stent, normal coronary. CP noncardiac, medical management    Hypertension   Intermediate coronary syndrome   Allergies No Known Allergies  Procedures  Cardiac catheterization 01/12/2014 HEMODYNAMICS: Aortic pressure 115/50 mmHg; LV pressure 119/9 mmHg ; LVEDP 10 mm mercury  ANGIOGRAPHIC DATA: The left main coronary artery is normal.  The left anterior descending artery is widely patent including the mid stent. No evidence of restenosis or obstruction in the diagonal or LAD to [place] to.  The left circumflex artery is widely patent. The discharge and to 2 obtuse marginal branches..  The right coronary artery is dominant and widely patent.  LEFT VENTRICULOGRAM: Left ventricular angiogram was done in the 30 RAO projection and revealed a normal sized cavity with normal contractility.  IMPRESSIONS: 1. Widely patent LAD stent  2. Otherwise normal coronary arteries.  3. Normal left ventricular function  RECOMMENDATION: No evidence of new disease or restenosis. Likely the chest discomfort was noncardiac or related to transient ischemia from vasoconstriction/thrombosis (doubtful given duration of symptoms and negative objective findings).     Hospital Course  The patient is an 80 year old Caucasian female with past medical history significant for  hypertension, hyperlipidemia, history of coronary artery disease status post stent to LAD in June 2014. Based on previous cath report, patient had 30-40% residual stenosis in posterolateral branch and distal RCA. Patient woke up around 10 AM in the morning of 01/09/2014 did not feel well. She had severe dizziness. Upon getting off the bed and start walking in the house, she felt mid substernal chest pressure similar to what she experienced last June. The chest discomfort eventually lasted about 2 hours and resolved around 12 PM prior to her ED visit.   On arrival to [facility] ED, her blood pressure was 125/59. O2 saturation 95% on room air. Heart rate 63. Initial troponin was negative. EKG did not have any significant changes. Given the similarity of her symptom with previous MI, patient was admitted for planned cardiac catheterization. Serial troponin were obtained which were negative.  Patient underwent planned cardiac catheterization in the morning of 01/12/2014 which revealed patent LAD stent with otherwise normal coronary arteries and normal left ventricular ejection fraction. Right radial cath site appears to stable without significant bleeding. Patient is deemed stable for discharge from cardiac perspective. I have scheduled a followup appointment with Dr. Percival Spanish in 2 month. She will be discharged to resume all her previous home medication.   Discharge Vitals Blood pressure 110/43, pulse 55, temperature 97.5 F (36.4 C), temperature source Oral, resp. rate 18, height 5\' 3"  (1.6 m), weight 164 lb 6.4 oz (74.571 kg), SpO2 94.00%.  Filed Weights   01/09/14 2100  Weight: 164 lb 6.4 oz (74.571 kg)    Labs  CBC  Recent Labs  01/09/14 1203 01/10/14 0300 01/11/14 0340  WBC 6.2 6.6 6.1  NEUTROABS 4.1  --   --   HGB 13.7 12.1 12.4  HCT 42.7 38.2 37.3  MCV 84.7 84.7 82.0  PLT 175 154 194*   Basic Metabolic Panel  Recent Labs  01/09/14 1203 01/10/14 0300  NA 143 144  K 3.8 4.0  CL  101 106  CO2 31 28  GLUCOSE 142* 94  BUN 19 23  CREATININE 0.92 1.03  CALCIUM 9.3 8.7   Cardiac Enzymes  Recent Labs  01/09/14 2143 01/10/14 0300 01/10/14 0755  TROPONINI <0.30 <0.30 <0.30   Fasting Lipid Panel  Recent Labs  01/10/14 0300  CHOL 117  HDL 62  LDLCALC 49  TRIG 32  CHOLHDL 1.9   Thyroid Function Tests No results found for this basename: TSH, T4TOTAL, FREET3, T3FREE, THYROIDAB,  in the last 72 hours  Disposition  Pt is being discharged home today in good condition.  Follow-up Plans & Appointments      Follow-up Information   Follow up with Minus Breeding, MD On 03/12/2014. (10:30am)    Specialty:  Cardiology   Contact information:   [address]  [place] [address] [phone]       Discharge Medications    Medication List         aspirin EC 81 MG tablet  Take 81 mg by mouth every morning.     atorvastatin 80 MG tablet  Commonly known as:  LIPITOR  Take 1 tablet (80 mg total) by mouth daily at 6 PM.     CALCIUM CARBONATE-VITAMIN D PO  Take by mouth every morning.     clopidogrel 75 MG tablet  Commonly known as:  PLAVIX  Take 75 mg by mouth daily.     famotidine 20 MG tablet  Commonly known as:  PEPCID  Take 1 tablet (20 mg total) by mouth 2 (two) times daily as needed for heartburn.     hydrochlorothiazide 25 MG tablet  Commonly known as:  HYDRODIURIL  Take 25 mg by mouth every morning.     metoprolol tartrate 25 MG tablet  Commonly known as:  LOPRESSOR  Take 25 mg by mouth 2 (two) times daily.     multivitamin with minerals Tabs tablet  Take 1 tablet by mouth every morning.     nitroGLYCERIN 0.4 MG SL tablet  Commonly known as:  NITROSTAT  Place 1 tablet (0.4 mg total) under the tongue every 5 (five) minutes x 3 doses as needed for chest pain.     potassium chloride SA 20 MEQ tablet  Commonly known as:  KLOR-CON M20  Take 1 tablet (20 mEq total) by mouth daily.        Duration of Discharge  Encounter   Greater than 30 minutes including physician time.  Hilbert Corrigan PA-C Pager: [phone] 01/12/2014, 11:10 AM

## 2014-01-12 NOTE — Progress Notes (Signed)
UR Completed Ivannah Zody Graves-Bigelow, RN,BSN [phone]  

## 2014-01-12 NOTE — H&P (View-Only) (Signed)
Patient Name: Nancy Wise Date of Encounter: 01/11/2014  Active Problems:   Intermediate coronary syndrome   Unstable angina   Length of Stay: 2  SUBJECTIVE  No angina overnight and no new complaints.  CURRENT MEDS . [START ON 01/12/2014] aspirin  81 mg Oral Pre-Cath  . aspirin EC  81 mg Oral q morning - 10a  . atorvastatin  80 mg Oral q1800  . calcium-vitamin D  1 tablet Oral Q breakfast  . clopidogrel  75 mg Oral Daily  . hydrochlorothiazide  25 mg Oral q morning - 10a  . metoprolol tartrate  25 mg Oral BID  . multivitamin with minerals  1 tablet Oral q morning - 10a  . potassium chloride SA  20 mEq Oral Daily  . sodium chloride  3 mL Intravenous Q12H    OBJECTIVE   Intake/Output Summary (Last 24 hours) at 01/11/14 0946 Last data filed at 01/11/14 0919  Gross per 24 hour  Intake    100 ml  Output      0 ml  Net    100 ml   Filed Weights   01/09/14 2100  Weight: 74.571 kg (164 lb 6.4 oz)    PHYSICAL EXAM Filed Vitals:   01/10/14 0923 01/10/14 1451 01/10/14 2100 01/11/14 0500  BP: 109/61 112/61 114/41 109/47  Pulse: 64 56 56 56  Temp:  98.1 F (36.7 C) 98.5 F (36.9 C) 98 F (36.7 C)  TempSrc:  Oral    Resp:   16 16  Height:      Weight:      SpO2: 100% 96% 97% 92%   General: Alert, oriented x3, no distress  Head: no evidence of trauma, PERRL, EOMI, no exophtalmos or lid lag, no myxedema, no xanthelasma; normal ears, nose and oropharynx  Neck: normal jugular venous pulsations and no hepatojugular reflux; brisk carotid pulses without delay and no carotid bruits  Chest: clear to auscultation, no signs of consolidation by percussion or palpation, normal fremitus, symmetrical and full respiratory excursions  Cardiovascular: normal position and quality of the apical impulse, regular rhythm, normal first and second heart sounds, no rubs or gallops, no murmur  Abdomen: no tenderness or distention, no masses by palpation, no abnormal pulsatility or  arterial bruits, normal bowel sounds, no hepatosplenomegaly  Extremities: no clubbing, cyanosis or edema; 2+ radial, ulnar and brachial pulses bilaterally; 2+ right femoral, posterior tibial and dorsalis pedis pulses; 2+ left femoral, posterior tibial and dorsalis pedis pulses; no subclavian or femoral bruits  Neurological: grossly nonfocal   LABS  CBC  Recent Labs  01/09/14 1203 01/10/14 0300 01/11/14 0340  WBC 6.2 6.6 6.1  NEUTROABS 4.1  --   --   HGB 13.7 12.1 12.4  HCT 42.7 38.2 37.3  MCV 84.7 84.7 82.0  PLT 175 154 478*   Basic Metabolic Panel  Recent Labs  01/09/14 1203 01/10/14 0300  NA 143 144  K 3.8 4.0  CL 101 106  CO2 31 28  GLUCOSE 142* 94  BUN 19 23  CREATININE 0.92 1.03  CALCIUM 9.3 8.7   Liver Function Tests No results found for this basename: AST, ALT, ALKPHOS, BILITOT, PROT, ALBUMIN,  in the last 72 hours No results found for this basename: LIPASE, AMYLASE,  in the last 72 hours Cardiac Enzymes  Recent Labs  01/09/14 2143 01/10/14 0300 01/10/14 0755  TROPONINI <0.30 <0.30 <0.30   BNP No components found with this basename: POCBNP,  D-Dimer No results found for this basename:  DDIMER,  in the last 72 hours Hemoglobin A1C No results found for this basename: HGBA1C,  in the last 72 hours Fasting Lipid Panel  Recent Labs  01/10/14 0300  CHOL 117  HDL 62  LDLCALC 49  TRIG 32  CHOLHDL 1.9   Thyroid Function Tests No results found for this basename: TSH, T4TOTAL, FREET3, T3FREE, THYROIDAB,  in the last 72 hours  Radiology Studies Imaging results have been reviewed and Dg Chest 2 View  01/09/2014   CLINICAL DATA:  Chest pain  EXAM: CHEST  2 VIEW  COMPARISON:  09/29/2012  FINDINGS: Mild hyperinflation with changes of COPD. Improvement in bibasilar atelectasis since prior study. Negative for heart failure or pneumonia. No mass or effusion.  IMPRESSION: No active cardiopulmonary disease.   Electronically Signed   By: Franchot Gallo M.D.   On:  01/09/2014 12:36    TELE NSR  ASSESSMENT AND PLAN For diagnostic cath +/- PCI in AM, for suspected unstable angina pectoris. Scheduled at 0730h.   Sanda Klein, MD, [facility] [phone] office [phone] pager 01/11/2014 9:46 AM

## 2014-01-12 NOTE — Discharge Instructions (Signed)
No driving for 24 hours. No lifting over 5 lbs for 1 week. No sexual activity for 1 week. Keep procedure site clean & dry. If you notice increased pain, swelling, bleeding or pus, call/return!  You may shower, but no soaking baths/hot tubs/pools for 1 week.    Angina Pectoris Angina pectoris is extreme discomfort in your chest, neck, or arm. Your doctor may call it just angina. It is caused by a lack of oxygen to your heart wall. It may feel like tightness or heavy pressure. It may feel like a crushing or squeezing pain. Some people say it feels like gas. It may go down your shoulders, back, and arms. Some people have symptoms other than pain. These include:  Tiredness.  Shortness of breath.  Cold sweats.  Feeling sick to your stomach (nausea). There are four types of angina:  Stable angina. This type often lasts the same amount of time each time it happens. Activity, stress, or excitement can bring it on. It often gets better after taking a medicine called nitroglycerin. This goes under your tongue.  Unstable angina. This type can happen when you are not active or even during sleep. It can suddenly get worse or happen more often. It may not get better after taking the special medicine. It can last up to 30 minutes.  Microvascular angina. This type is more common in women. It may be more severe or last longer than other types.  Prinzmetal angina. This type often happens when you are not active or in the early morning hours. HOME CARE   Only take medicines as told by your doctor.  Stay active or exercise more as told by your doctor.  Limit very hard activity as told by your doctor.  Limit heavy lifting as told by your doctor.  Keep a healthy weight.  Learn about and eat foods that are healthy for your heart.  Do not use any tobacco such as cigarettes, chewing tobacco, or e-cigarettes. GET HELP RIGHT AWAY IF:   You have chest, neck, deep shoulder, or arm pain or discomfort that  lasts more than a few minutes.  You have chest, neck, deep shoulder, or arm pain or discomfort that goes away and comes back over and over again.  You have heavy sweating that seems to happen for no reason.  You have shortness of breath or trouble breathing.  Your angina does not get better after a few minutes of rest.  Your angina does not get better after you take nitroglycerin medicine. These can all be symptoms of a heart attack. Get help right away. Call your local emergency service (911 in [place] Do not  drive yourself to the hospital. Do not  wait to for your symptoms to go away. MAKE SURE YOU:   Understand these instructions.  Will watch your condition.  Will get help right away if you are not doing well or get worse. Document Released: 09/27/2007 Document Revised: 04/15/2013 Document Reviewed: 08/12/2013 [facility] Patient Information 2015 [place], [place]. This information is not intended to replace advice given to you by your health care provider. Make sure you discuss any questions you have with your health care provider.

## 2014-01-12 NOTE — CV Procedure (Signed)
    Left Heart Catheterization with Coronary Angiography  Report  Nancy Wise  80 y.o.  female February 25, 1930  Procedure Date: 01/12/2014 Referring Physician: Marijo File, MD  Primary Cardiologist: Same  INDICATIONS: Unstable angina  PROCEDURE: 1. Left heart catheterization; 2. Coronary angiography; 3. Left ventriculography  CONSENT:  The risks, benefits, and details of the procedure were explained in detail to the patient. Risks including death, stroke, heart attack, kidney injury, allergy, limb ischemia, bleeding and radiation injury were discussed.  The patient verbalized understanding and wanted to proceed.  Informed written consent was obtained.  PROCEDURE TECHNIQUE:  After Xylocaine anesthesia a 5 French Slender sheath was placed in the right radial artery with an angiocath and the modified Seldinger technique.  Coronary angiography was done using a 5 F JR 4 and JL 3.5 cm diagnostic catheters.  Left ventriculography was done using the JR 4 catheter and hand injection.   Images were reviewed and the case was terminated.  Hemostasis was achieved with Aristospan at 13 cc of air.   CONTRAST:  Total of 90 cc.  COMPLICATIONS:  None   HEMODYNAMICS:  Aortic pressure 115/50 mmHg; LV pressure 119/9 mmHg ; LVEDP 10 mm mercury  ANGIOGRAPHIC DATA:   The left main coronary artery is normal.  The left anterior descending artery is widely patent including the mid stent. No evidence of restenosis or obstruction in the diagonal or LAD to [place] to.  The left circumflex artery is widely patent. The discharge and to 2 obtuse marginal branches..  The right coronary artery is dominant and widely patent.   LEFT VENTRICULOGRAM:  Left ventricular angiogram was done in the 30 RAO projection and revealed a normal sized cavity with normal contractility.   IMPRESSIONS:  1. Widely patent LAD stent 2. Otherwise normal coronary arteries. 3. Normal left ventricular  function   RECOMMENDATION:  No evidence of new disease or restenosis. Likely the chest discomfort was noncardiac or related to transient ischemia from vasoconstriction/thrombosis (doubtful given duration of symptoms and negative objective findings).

## 2014-01-12 NOTE — Progress Notes (Signed)
Reviewed discharge instructions with patient and son and they stated their understanding.  Discharged home with son via wheelchair. Right radial site level 0.  Sanda Linger

## 2014-01-12 NOTE — Interval H&P Note (Signed)
Cath Lab Visit (complete for each Cath Lab visit)  Clinical Evaluation Leading to the Procedure:   ACS: Yes.    Non-ACS:    Anginal Classification: CCS III  Anti-ischemic medical therapy: Minimal Therapy (1 class of medications)  Non-Invasive Test Results: No non-invasive testing performed  Prior CABG: No previous CABG      History and Physical Interval Note:  01/12/2014 7:56 AM  Arther Abbott  has presented today for surgery, with the diagnosis of cp  The various methods of treatment have been discussed with the patient and family. After consideration of risks, benefits and other options for treatment, the patient has consented to  Procedure(s): LEFT HEART CATHETERIZATION WITH CORONARY ANGIOGRAM (N/A) as a surgical intervention .  The patient's history has been reviewed, patient examined, no change in status, stable for surgery.  I have reviewed the patient's chart and labs.  Questions were answered to the patient's satisfaction.     Sinclair Grooms

## 2014-01-12 NOTE — Progress Notes (Signed)
Patient seen and examined and history reviewed. Agree with above findings and plan. No significant CAD noted on cath. Will plan DC today and continue prior medications.  Peter Martinique, Portal 01/12/2014 2:00 PM

## 2014-01-12 NOTE — Progress Notes (Signed)
Patient Name: Nancy Wise Date of Encounter: 01/12/2014     Principal Problem:   Chest pain Active Problems:   CAD (coronary artery disease)   Hypertension   Intermediate coronary syndrome    SUBJECTIVE  Denies any CP or SOB.   CURRENT MEDS . aspirin EC  81 mg Oral q morning - 10a  . atorvastatin  80 mg Oral q1800  . calcium-vitamin D  1 tablet Oral Q breakfast  . clopidogrel  75 mg Oral Daily  . hydrochlorothiazide  25 mg Oral q morning - 10a  . metoprolol tartrate  25 mg Oral BID  . multivitamin with minerals  1 tablet Oral q morning - 10a  . potassium chloride SA  20 mEq Oral Daily    OBJECTIVE  Filed Vitals:   01/12/14 0500 01/12/14 0733 01/12/14 0850 01/12/14 0905  BP: 111/40  107/38 110/43  Pulse: 56 57 51 55  Temp: 97.8 F (36.6 C)  97.5 F (36.4 C)   TempSrc:   Oral   Resp: 16  18   Height:      Weight:      SpO2: 97%  95% 94%    Intake/Output Summary (Last 24 hours) at 01/12/14 1047 Last data filed at 01/12/14 0200  Gross per 24 hour  Intake 532.21 ml  Output      0 ml  Net 532.21 ml   Filed Weights   01/09/14 2100  Weight: 164 lb 6.4 oz (74.571 kg)    PHYSICAL EXAM  General: Pleasant, NAD. Neuro: Alert and oriented X 3. Moves all extremities spontaneously. Psych: Normal affect. HEENT:  Normal  Neck: Supple without bruits or JVD. Lungs:  Resp regular and unlabored, CTA. Heart: RRR no s3, s4, or murmurs. Abdomen: Soft, non-tender, non-distended, BS + x 4.  Extremities: No clubbing, cyanosis or edema. DP/PT/Radials 2+ and equal bilaterally.  Accessory Clinical Findings  CBC  Recent Labs  01/09/14 1203 01/10/14 0300 01/11/14 0340  WBC 6.2 6.6 6.1  NEUTROABS 4.1  --   --   HGB 13.7 12.1 12.4  HCT 42.7 38.2 37.3  MCV 84.7 84.7 82.0  PLT 175 154 263*   Basic Metabolic Panel  Recent Labs  01/09/14 1203 01/10/14 0300  NA 143 144  K 3.8 4.0  CL 101 106  CO2 31 28  GLUCOSE 142* 94  BUN 19 23  CREATININE 0.92  1.03  CALCIUM 9.3 8.7   Cardiac Enzymes  Recent Labs  01/09/14 2143 01/10/14 0300 01/10/14 0755  TROPONINI <0.30 <0.30 <0.30   Fasting Lipid Panel  Recent Labs  01/10/14 0300  CHOL 117  HDL 62  LDLCALC 49  TRIG 32  CHOLHDL 1.9    TELE NSR with HR 50s, no significant ventricular ectopy    ECG  Sinus brady with HR 50   Radiology/Studies  Dg Chest 2 View  01/09/2014   CLINICAL DATA:  Chest pain  EXAM: CHEST  2 VIEW  COMPARISON:  09/29/2012  FINDINGS: Mild hyperinflation with changes of COPD. Improvement in bibasilar atelectasis since prior study. Negative for heart failure or pneumonia. No mass or effusion.  IMPRESSION: No active cardiopulmonary disease.   Electronically Signed   By: Franchot Gallo M.D.   On: 01/09/2014 12:36    ASSESSMENT AND PLAN  1. Chest pain   - cath 01/12/2014 EF preserved EF, widely patent LAD stent, otherwise normal CAD - plan for discharge today to follow up with Dr. Percival Spanish  2. CAD  -  Cath 09/30/2012 showed focal 80-90% mid LAD stenosis treated with drug-eluting stent, diffuse 30-40% residual stenosis posterior lateral branch, 30% disease in distal RCA, EF 55-60%  3. HTN  4. Hyperlepidemia   Signed, Almyra Deforest PA-C Pager: [phone]

## 2014-01-12 NOTE — Care Management Note (Unsigned)
    Page 1 of 1   01/12/2014     10:09:18 AM CARE MANAGEMENT NOTE 01/12/2014  Patient:  Nancy Wise, Nancy Wise   Account Number:  [id number]  Date Initiated:  01/12/2014  Documentation initiated by:  GRAVES-BIGELOW,Saba Neuman  Subjective/Objective Assessment:   Pt admitted for Chest pain concerning for unstable angina. S/p cath.     Action/Plan:   CM to continue to monitor for disposition needs.   Anticipated DC Date:  01/13/2014   Anticipated DC Plan:  [place]  CM consult      Choice offered to / List presented to:             Status of service:  In process, will continue to follow Medicare Important Message given?  YES (If response is "NO", the following Medicare IM given date fields will be blank) Date Medicare IM given:  01/12/2014 Medicare IM given by:  GRAVES-BIGELOW,Thorin Starner Date Additional Medicare IM given:   Additional Medicare IM given by:    Discharge Disposition:    Per UR Regulation:  Reviewed for med. necessity/level of care/duration of stay  If discussed at [place] of Stay Meetings, dates discussed:    Comments:

## 2014-01-12 NOTE — Progress Notes (Signed)
Right radial TR band removed without complications.  2x2 and tegaderm applied.  Radial site level 0.  Patient given radial site instructions.  Travanti Mcmanus Danielle   

## 2014-01-17 ENCOUNTER — Other Ambulatory Visit (HOSPITAL_COMMUNITY): Payer: Self-pay | Admitting: Nurse Practitioner

## 2014-03-12 ENCOUNTER — Encounter: Payer: Self-pay | Admitting: Cardiology

## 2014-03-12 ENCOUNTER — Ambulatory Visit (INDEPENDENT_AMBULATORY_CARE_PROVIDER_SITE_OTHER): Payer: Medicare Other | Admitting: Cardiology

## 2014-03-12 VITALS — BP 128/72 | HR 59 | Ht 63.0 in | Wt 164.4 lb

## 2014-03-12 DIAGNOSIS — I2510 Atherosclerotic heart disease of native coronary artery without angina pectoris: Secondary | ICD-10-CM

## 2014-03-12 DIAGNOSIS — I2583 Coronary atherosclerosis due to lipid rich plaque: Secondary | ICD-10-CM

## 2014-03-12 NOTE — Progress Notes (Signed)
HPI The patient returns for follow up of CAD.  Since I last saw her was hospitalized with chest pain. On September 21 she had a catheterization demonstrating a patent stent and no other obstructive disease. Since that time she has had no further cardiovascular problems. She does some exercise with Silver Sneakers.  The patient denies any new symptoms such as chest discomfort, neck or arm discomfort. There has been no new shortness of breath, PND or orthopnea. There have been no reported palpitations, presyncope or syncope.  No Known Allergies  Current Outpatient Prescriptions  Medication Sig Dispense Refill  . aspirin EC 81 MG tablet Take 81 mg by mouth every morning.    [name] atorvastatin (LIPITOR) 80 MG tablet Take 1 tablet (80 mg total) by mouth daily at 6 PM. 90 tablet 2  . CALCIUM CARBONATE-VITAMIN D PO Take by mouth every morning.    . clopidogrel (PLAVIX) 75 MG tablet Take 75 mg by mouth daily.    . famotidine (PEPCID) 20 MG tablet Take 1 tablet (20 mg total) by mouth 2 (two) times daily as needed for heartburn.    . hydrochlorothiazide (HYDRODIURIL) 25 MG tablet Take 25 mg by mouth every morning.    . metoprolol tartrate (LOPRESSOR) 25 MG tablet Take 25 mg by mouth 2 (two) times daily.    . Multiple Vitamin (MULTIVITAMIN WITH MINERALS) TABS tablet Take 1 tablet by mouth every morning.    [name] NITROSTAT 0.4 MG SL tablet PLACE 1 TABLET UNDER TONGUE EVERY 5 MINUTES X3 DAYS AS NEEDED FOR CHEST PAIN 25 tablet 4  . potassium chloride SA (KLOR-CON M20) 20 MEQ tablet Take 1 tablet (20 mEq total) by mouth daily. 90 tablet 2   No current facility-administered medications for this visit.    Past Medical History  Diagnosis Date  . Hypertension   . Bladder retention   . Melanoma of foot   . Basal cell carcinoma of nose   . Coronary artery disease     a. 09/2012 NSTEMI/Cath/PCI: LM nl, LAD 80-33m (2.25x16 Promus Premier DES), LCX nl, RCA 30-40 diff in PL, 30 PDA, EF 55-60%. b. cath 01/12/2014 EF  preserved EF, widely patent LAD stent, otherwise normal CAD  . Obesity     Past Surgical History  Procedure Laterality Date  . Tonsillectomy  1930's  . Cesarean section  1968  . Coronary angioplasty with stent placement  09/30/2012    "1" (09/30/2012)  . Melanoma excision Left 2013    'foot" (09/30/2012)  . Basal cell carcinoma excision Right 2011?    "nose" (09/30/2012)    ROS:  As stated in the HPI and negative for all other systems.  PHYSICAL EXAM BP 128/72 mmHg  Pulse 59  Ht 5\' 3"  (1.6 m)  Wt 164 lb 6.4 oz (74.571 kg)  BMI 29.13 kg/m2 GENERAL:  Well appearing NECK:  No jugular venous distention, waveform within normal limits, carotid upstroke brisk and symmetric, no bruits, no thyromegaly LUNGS:  Clear to auscultation bilaterally BACK:  No CVA tenderness CHEST:  Unremarkable HEART:  PMI not displaced or sustained,S1 and S2 within normal limits, no S3, no S4, no clicks, no rubs, no murmurs ABD:  Flat, positive bowel sounds normal in frequency in pitch, no bruits, no rebound, no guarding, no midline pulsatile mass, no hepatomegaly, no splenomegaly EXT:  2 plus pulses throughout, no edema, no cyanosis no clubbing   EKG:  Sinus rhythm, rate 59, axis within normal limits, intervals within normal limits, no acute  ST-T wave changes.  03/12/2014   ASSESSMENT AND PLAN  CAD: The patient has no new sypmtoms.  No further cardiovascular testing is indicated.  We will continue with aggressive risk reduction and meds as listed.  HTN:  The blood pressure is at target. No change in medications is indicated. We will continue with therapeutic lifestyle changes (TLC).  DYSLIPIDEMIA:   Her LDL was only 49.  She can reduce to 40 mg of Lipitor.    I reviewed her hospital records including labs and cath.

## 2014-03-12 NOTE — Patient Instructions (Signed)
Your physician recommends that you schedule a follow-up appointment in: .June 2016  Decrease your lipitor to 40 mg daily

## 2014-04-02 ENCOUNTER — Encounter (HOSPITAL_COMMUNITY): Payer: Self-pay | Admitting: Cardiology

## 2014-06-12 ENCOUNTER — Other Ambulatory Visit (HOSPITAL_COMMUNITY): Payer: Self-pay | Admitting: Internal Medicine

## 2014-06-12 DIAGNOSIS — Z1231 Encounter for screening mammogram for malignant neoplasm of breast: Secondary | ICD-10-CM

## 2014-07-02 ENCOUNTER — Ambulatory Visit (HOSPITAL_COMMUNITY)
Admission: RE | Admit: 2014-07-02 | Discharge: 2014-07-02 | Disposition: A | Discharge status: Home / Self Care | Payer: Medicare Other | Source: Ambulatory Visit | Attending: Internal Medicine | Admitting: Internal Medicine

## 2014-07-02 DIAGNOSIS — Z1231 Encounter for screening mammogram for malignant neoplasm of breast: Secondary | ICD-10-CM

## 2014-09-06 ENCOUNTER — Other Ambulatory Visit: Payer: Self-pay | Admitting: Cardiology

## 2014-09-10 ENCOUNTER — Ambulatory Visit (INDEPENDENT_AMBULATORY_CARE_PROVIDER_SITE_OTHER): Payer: Medicare Other | Admitting: Cardiology

## 2014-09-10 ENCOUNTER — Encounter: Payer: Self-pay | Admitting: Cardiology

## 2014-09-10 VITALS — BP 108/70 | HR 49 | Ht 63.0 in | Wt 164.0 lb

## 2014-09-10 DIAGNOSIS — I2510 Atherosclerotic heart disease of native coronary artery without angina pectoris: Secondary | ICD-10-CM | POA: Diagnosis not present

## 2014-09-10 DIAGNOSIS — I10 Essential (primary) hypertension: Secondary | ICD-10-CM | POA: Diagnosis not present

## 2014-09-10 NOTE — Patient Instructions (Signed)
Your physician recommends that you schedule a follow-up appointment in: one year with Dr. Percival Spanish  Stop taking your plavix

## 2014-09-10 NOTE — Progress Notes (Signed)
HPI The patient returns for follow up of CAD.  Since I last saw her she has had no further chest pain.  She is participating in [employer].  The patient denies any new symptoms such as chest discomfort, neck or arm discomfort. There has been no new shortness of breath, PND or orthopnea. There have been no reported palpitations, presyncope or syncope.    No Known Allergies  Current Outpatient Prescriptions  Medication Sig Dispense Refill  . aspirin EC 81 MG tablet Take 81 mg by mouth every morning.    [name] atorvastatin (LIPITOR) 40 MG tablet Take 40 mg by mouth daily.    [name] CALCIUM CARBONATE-VITAMIN D PO Take by mouth every morning.    . clopidogrel (PLAVIX) 75 MG tablet Take 75 mg by mouth daily.    . famotidine (PEPCID) 20 MG tablet Take 1 tablet (20 mg total) by mouth 2 (two) times daily as needed for heartburn.    . hydrochlorothiazide (HYDRODIURIL) 25 MG tablet Take 25 mg by mouth every morning.    . metoprolol tartrate (LOPRESSOR) 25 MG tablet Take 25 mg by mouth 2 (two) times daily.    . Multiple Vitamin (MULTIVITAMIN WITH MINERALS) TABS tablet Take 1 tablet by mouth every morning.    [name] NITROSTAT 0.4 MG SL tablet PLACE 1 TABLET UNDER TONGUE EVERY 5 MINUTES X3 DAYS AS NEEDED FOR CHEST PAIN 25 tablet 4  . potassium chloride SA (KLOR-CON M20) 20 MEQ tablet Take 1 tablet (20 mEq total) by mouth daily. 90 tablet 2   No current facility-administered medications for this visit.    Past Medical History  Diagnosis Date  . Hypertension   . Bladder retention   . Melanoma of foot   . Basal cell carcinoma of nose   . Coronary artery disease     a. 09/2012 NSTEMI/Cath/PCI: LM nl, LAD 80-43m (2.25x16 Promus Premier DES), LCX nl, RCA 30-40 diff in PL, 30 PDA, EF 55-60%. b. cath 01/12/2014 EF preserved EF, widely patent LAD stent, otherwise normal CAD  . Obesity     Past Surgical History  Procedure Laterality Date  . Tonsillectomy  1930's  . Cesarean section  1968  . Coronary  angioplasty with stent placement  09/30/2012    "1" (09/30/2012)  . Melanoma excision Left 2013    'foot" (09/30/2012)  . Basal cell carcinoma excision Right 2011?    "nose" (09/30/2012)  . Left heart catheterization with coronary angiogram N/A 09/30/2012    Procedure: LEFT HEART CATHETERIZATION WITH CORONARY ANGIOGRAM;  Surgeon: Peter M Martinique, MD;  Location[facility] CATH LAB;  Service: Cardiovascular;  Laterality: N/A;  . Percutaneous coronary stent intervention (pci-s)  09/30/2012    Procedure: PERCUTANEOUS CORONARY STENT INTERVENTION (PCI-S);  Surgeon: Peter M Martinique, MD;  Location: [facility] CATH LAB;  Service: Cardiovascular;;  . Left heart catheterization with coronary angiogram N/A 01/12/2014    Procedure: LEFT HEART CATHETERIZATION WITH CORONARY ANGIOGRAM;  Surgeon: Sinclair Grooms, MD;  Location: [facility] CATH LAB;  Service: Cardiovascular;  Laterality: N/A;    ROS:  As stated in the HPI and negative for all other systems.  PHYSICAL EXAM BP 108/70 mmHg  Pulse 49  Ht 5\' 3"  (1.6 m)  Wt 164 lb (74.39 kg)  BMI 29.06 kg/m2 GENERAL:  Well appearing NECK:  No jugular venous distention, waveform within normal limits, carotid upstroke brisk and symmetric, no bruits, no thyromegaly LUNGS:  Clear to auscultation bilaterally BACK:  No CVA tenderness CHEST:  Unremarkable HEART:  PMI not displaced or sustained,S1 and S2 within normal limits, no S3, no S4, no clicks, no rubs, no murmurs ABD:  Flat, positive bowel sounds normal in frequency in pitch, no bruits, no rebound, no guarding, no midline pulsatile mass, no hepatomegaly, no splenomegaly EXT:  2 plus pulses throughout, no edema, no cyanosis no clubbing   EKG:  Sinus rhythm, rate 49, axis within normal limits, intervals within normal limits, no acute ST-T wave changes.  09/10/2014   ASSESSMENT AND PLAN  CAD: The patient has no new sypmtoms.  No further cardiovascular testing is indicated.  We will continue with aggressive risk reduction and meds as listed.   She can stop her Plavix.    HTN:  The blood pressure is at target. No change in medications is indicated. We will continue with therapeutic lifestyle changes (TLC).  DYSLIPIDEMIA:   Her LDL was only 49.  She reduced to 40 mg of Lipitor at the last visit.  I will defer follow up to GREEN, Keenan Bachelor, MD.

## 2014-09-29 ENCOUNTER — Other Ambulatory Visit: Payer: Self-pay | Admitting: *Deleted

## 2014-09-29 MED ORDER — METOPROLOL TARTRATE 25 MG PO TABS: 25.0000 mg | ORAL_TABLET | Freq: Two times a day (BID) | ORAL | Status: DC

## 2014-12-14 ENCOUNTER — Other Ambulatory Visit: Payer: Self-pay | Admitting: *Deleted

## 2014-12-14 ENCOUNTER — Other Ambulatory Visit: Payer: Self-pay | Admitting: Cardiology

## 2014-12-14 MED ORDER — POTASSIUM CHLORIDE CRYS ER 20 MEQ PO TBCR
20.0000 meq | EXTENDED_RELEASE_TABLET | Freq: Every day | ORAL | Status: DC
Start: 2014-12-14 — End: 2015-09-15

## 2015-01-13 ENCOUNTER — Other Ambulatory Visit: Payer: Self-pay | Admitting: Internal Medicine

## 2015-01-13 ENCOUNTER — Ambulatory Visit
Admission: RE | Admit: 2015-01-13 | Discharge: 2015-01-13 | Disposition: A | Discharge status: Home / Self Care | Payer: Medicare Other | Source: Ambulatory Visit | Attending: Internal Medicine | Admitting: Internal Medicine

## 2015-01-13 DIAGNOSIS — S0990XA Unspecified injury of head, initial encounter: Secondary | ICD-10-CM

## 2015-04-08 ENCOUNTER — Other Ambulatory Visit: Payer: Self-pay | Admitting: Cardiology

## 2015-04-08 NOTE — Telephone Encounter (Signed)
Rx(s) sent to pharmacy electronically.  

## 2015-06-11 DIAGNOSIS — D510 Vitamin B12 deficiency anemia due to intrinsic factor deficiency: Secondary | ICD-10-CM | POA: Diagnosis not present

## 2015-06-11 DIAGNOSIS — I10 Essential (primary) hypertension: Secondary | ICD-10-CM | POA: Diagnosis not present

## 2015-06-11 DIAGNOSIS — D039 Melanoma in situ, unspecified: Secondary | ICD-10-CM | POA: Diagnosis not present

## 2015-06-11 DIAGNOSIS — I2510 Atherosclerotic heart disease of native coronary artery without angina pectoris: Secondary | ICD-10-CM | POA: Diagnosis not present

## 2015-06-11 DIAGNOSIS — D559 Anemia due to enzyme disorder, unspecified: Secondary | ICD-10-CM | POA: Diagnosis not present

## 2015-06-11 DIAGNOSIS — K635 Polyp of colon: Secondary | ICD-10-CM | POA: Diagnosis not present

## 2015-06-11 DIAGNOSIS — Z0000 Encounter for general adult medical examination without abnormal findings: Secondary | ICD-10-CM | POA: Diagnosis not present

## 2015-06-18 ENCOUNTER — Encounter: Payer: Self-pay | Admitting: Gastroenterology

## 2015-06-18 ENCOUNTER — Other Ambulatory Visit: Payer: Self-pay | Admitting: Cardiology

## 2015-06-18 MED ORDER — METOPROLOL TARTRATE 25 MG PO TABS: 25.0000 mg | ORAL_TABLET | Freq: Two times a day (BID) | ORAL | Status: DC

## 2015-06-18 MED ORDER — ATORVASTATIN CALCIUM 40 MG PO TABS: 40.0000 mg | ORAL_TABLET | Freq: Every day | ORAL | Status: DC

## 2015-07-04 ENCOUNTER — Other Ambulatory Visit: Payer: Self-pay | Admitting: Cardiology

## 2015-08-03 ENCOUNTER — Other Ambulatory Visit: Payer: Self-pay

## 2015-08-03 DIAGNOSIS — Z1231 Encounter for screening mammogram for malignant neoplasm of breast: Secondary | ICD-10-CM

## 2015-08-12 DIAGNOSIS — Z86018 Personal history of other benign neoplasm: Secondary | ICD-10-CM | POA: Diagnosis not present

## 2015-08-12 DIAGNOSIS — D2271 Melanocytic nevi of right lower limb, including hip: Secondary | ICD-10-CM | POA: Diagnosis not present

## 2015-08-12 DIAGNOSIS — D2262 Melanocytic nevi of left upper limb, including shoulder: Secondary | ICD-10-CM | POA: Diagnosis not present

## 2015-08-12 DIAGNOSIS — L821 Other seborrheic keratosis: Secondary | ICD-10-CM | POA: Diagnosis not present

## 2015-08-12 DIAGNOSIS — D225 Melanocytic nevi of trunk: Secondary | ICD-10-CM | POA: Diagnosis not present

## 2015-08-12 DIAGNOSIS — Z87898 Personal history of other specified conditions: Secondary | ICD-10-CM | POA: Diagnosis not present

## 2015-08-12 DIAGNOSIS — Z85828 Personal history of other malignant neoplasm of skin: Secondary | ICD-10-CM | POA: Diagnosis not present

## 2015-08-19 ENCOUNTER — Ambulatory Visit
Admission: RE | Admit: 2015-08-19 | Discharge: 2015-08-19 | Disposition: A | Discharge status: Home / Self Care | Payer: Medicare Other | Source: Ambulatory Visit

## 2015-08-19 DIAGNOSIS — Z1231 Encounter for screening mammogram for malignant neoplasm of breast: Secondary | ICD-10-CM

## 2015-09-03 DIAGNOSIS — H524 Presbyopia: Secondary | ICD-10-CM | POA: Diagnosis not present

## 2015-09-15 ENCOUNTER — Other Ambulatory Visit: Payer: Self-pay

## 2015-09-16 ENCOUNTER — Ambulatory Visit (INDEPENDENT_AMBULATORY_CARE_PROVIDER_SITE_OTHER): Payer: Medicare Other | Admitting: Cardiology

## 2015-09-16 ENCOUNTER — Encounter: Payer: Self-pay | Admitting: Cardiology

## 2015-09-16 VITALS — BP 120/64 | HR 62 | Ht 63.0 in | Wt 163.0 lb

## 2015-09-16 DIAGNOSIS — R0789 Other chest pain: Secondary | ICD-10-CM

## 2015-09-16 MED ORDER — POTASSIUM CHLORIDE CRYS ER 20 MEQ PO TBCR
20.0000 meq | EXTENDED_RELEASE_TABLET | Freq: Every day | ORAL | Status: DC
Start: 2015-09-16 — End: 2015-09-16

## 2015-09-16 MED ORDER — POTASSIUM CHLORIDE CRYS ER 20 MEQ PO TBCR: 20.0000 meq | EXTENDED_RELEASE_TABLET | Freq: Every day | ORAL | Status: DC

## 2015-09-16 NOTE — Progress Notes (Signed)
HPI The patient returns for follow up of CAD.  Since I last saw her she has had no further chest pain.  She is still participating in [employer].  The patient denies any new symptoms such as chest discomfort, neck or arm discomfort. There has been no new shortness of breath, PND or orthopnea. There have been no reported palpitations, presyncope or syncope.     No Known Allergies  Current Outpatient Prescriptions  Medication Sig Dispense Refill  . aspirin EC 81 MG tablet Take 81 mg by mouth every morning.    [name] atorvastatin (LIPITOR) 40 MG tablet Take 1 tablet (40 mg total) by mouth daily. 30 tablet 2  . atorvastatin (LIPITOR) 80 MG tablet Take 1 tablet (80 mg total) by mouth daily at 6 PM. Please make appt for refills. 90 tablet 0  . CALCIUM CARBONATE-VITAMIN D PO Take by mouth every morning.    . famotidine (PEPCID) 20 MG tablet Take 1 tablet (20 mg total) by mouth 2 (two) times daily as needed for heartburn.    . hydrochlorothiazide (HYDRODIURIL) 25 MG tablet Take 25 mg by mouth every morning.    . metoprolol tartrate (LOPRESSOR) 25 MG tablet Take 1 tablet (25 mg total) by mouth 2 (two) times daily. 60 tablet 2  . Multiple Vitamin (MULTIVITAMIN WITH MINERALS) TABS tablet Take 1 tablet by mouth every morning.    . Multiple Vitamins-Minerals (PRESERVISION AREDS 2 PO) Take 1 tablet by mouth daily.    [name] NITROSTAT 0.4 MG SL tablet PLACE 1 TABLET UNDER TONGUE EVERY 5 MINUTES FOR 3 DAYS AS NEEDED FOR CHEST PAIN 25 tablet 1  . potassium chloride SA (KLOR-CON M20) 20 MEQ tablet Take 1 tablet (20 mEq total) by mouth daily. 90 tablet 2   No current facility-administered medications for this visit.    Past Medical History  Diagnosis Date  . Hypertension   . Bladder retention   . Melanoma of foot ([place])   . Basal cell carcinoma of nose   . Coronary artery disease     a. 09/2012 NSTEMI/Cath/PCI: LM nl, LAD 80-1m (2.25x16 Promus Premier DES), LCX nl, RCA 30-40 diff in PL, 30 PDA, EF 55-60%.  b. cath 01/12/2014 EF preserved EF, widely patent LAD stent, otherwise normal CAD  . Obesity     Past Surgical History  Procedure Laterality Date  . Tonsillectomy  1930's  . Cesarean section  1968  . Coronary angioplasty with stent placement  09/30/2012    "1" (09/30/2012)  . Melanoma excision Left 2013    'foot" (09/30/2012)  . Basal cell carcinoma excision Right 2011?    "nose" (09/30/2012)  . Left heart catheterization with coronary angiogram N/A 09/30/2012    Procedure: LEFT HEART CATHETERIZATION WITH CORONARY ANGIOGRAM;  Surgeon: Peter M Martinique, MD;  Location: [facility] CATH LAB;  Service: Cardiovascular;  Laterality: N/A;  . Percutaneous coronary stent intervention (pci-s)  09/30/2012    Procedure: PERCUTANEOUS CORONARY STENT INTERVENTION (PCI-S);  Surgeon: Peter M Martinique, MD;  Location: [facility] CATH LAB;  Service: Cardiovascular;;  . Left heart catheterization with coronary angiogram N/A 01/12/2014    Procedure: LEFT HEART CATHETERIZATION WITH CORONARY ANGIOGRAM;  Surgeon: Sinclair Grooms, MD;  Location: [facility] CATH LAB;  Service: Cardiovascular;  Laterality: N/A;    ROS:  As stated in the HPI and negative for all other systems.  PHYSICAL EXAM BP 120/64 mmHg  Pulse 62  Ht 5\' 3"  (1.6 m)  Wt 163 lb (73.936 kg)  BMI  28.88 kg/m2 GENERAL:  Well appearing NECK:  No jugular venous distention, waveform within normal limits, carotid upstroke brisk and symmetric, no bruits, no thyromegaly LUNGS:  Clear to auscultation bilaterally BACK:  No CVA tenderness CHEST:  Unremarkable HEART:  PMI not displaced or sustained,S1 and S2 within normal limits, no S3, no S4, no clicks, no rubs, no murmurs ABD:  Flat, positive bowel sounds normal in frequency in pitch, no bruits, no rebound, no guarding, no midline pulsatile mass, no hepatomegaly, no splenomegaly EXT:  2 plus pulses throughout, no edema, no cyanosis no clubbing   EKG:  Sinus rhythm, rate 62, axis within normal limits, intervals within normal limits, no acute  ST-T wave changes.  09/16/2015   ASSESSMENT AND PLAN  CAD: The patient has no new sypmtoms.  No further cardiovascular testing is indicated.  We will continue with aggressive risk reduction and meds as listed.    HTN:  The blood pressure is at target. No change in medications is indicated. We will continue with therapeutic lifestyle changes (TLC).  DYSLIPIDEMIA:   I will defer follow up to GREEN, Keenan Bachelor, MD.

## 2015-09-16 NOTE — Patient Instructions (Signed)
Your physician wants you to follow-up in: 1 Year. You will receive a reminder letter in the mail two months in advance. If you don't receive a letter, please call our office to schedule the follow-up appointment.  

## 2015-09-16 NOTE — Telephone Encounter (Signed)
Rx(s) sent to pharmacy electronically.  

## 2015-09-22 ENCOUNTER — Other Ambulatory Visit: Payer: Self-pay | Admitting: *Deleted

## 2015-09-22 MED ORDER — POTASSIUM CHLORIDE CRYS ER 20 MEQ PO TBCR: 20.0000 meq | EXTENDED_RELEASE_TABLET | Freq: Every day | ORAL | Status: DC

## 2015-11-03 ENCOUNTER — Other Ambulatory Visit: Payer: Self-pay | Admitting: Cardiology

## 2015-11-04 ENCOUNTER — Other Ambulatory Visit: Payer: Self-pay | Admitting: *Deleted

## 2015-11-04 MED ORDER — ATORVASTATIN CALCIUM 80 MG PO TABS: 80.0000 mg | ORAL_TABLET | Freq: Every day | ORAL | Status: DC

## 2015-12-02 DIAGNOSIS — I10 Essential (primary) hypertension: Secondary | ICD-10-CM | POA: Diagnosis not present

## 2015-12-02 DIAGNOSIS — I2510 Atherosclerotic heart disease of native coronary artery without angina pectoris: Secondary | ICD-10-CM | POA: Diagnosis not present

## 2015-12-04 ENCOUNTER — Other Ambulatory Visit: Payer: Self-pay | Admitting: Cardiology

## 2015-12-29 DIAGNOSIS — H2513 Age-related nuclear cataract, bilateral: Secondary | ICD-10-CM | POA: Diagnosis not present

## 2015-12-29 DIAGNOSIS — H353131 Nonexudative age-related macular degeneration, bilateral, early dry stage: Secondary | ICD-10-CM | POA: Diagnosis not present

## 2016-02-01 DIAGNOSIS — H25812 Combined forms of age-related cataract, left eye: Secondary | ICD-10-CM | POA: Diagnosis not present

## 2016-02-01 DIAGNOSIS — H2512 Age-related nuclear cataract, left eye: Secondary | ICD-10-CM | POA: Diagnosis not present

## 2016-02-01 DIAGNOSIS — H21562 Pupillary abnormality, left eye: Secondary | ICD-10-CM | POA: Diagnosis not present

## 2016-02-17 ENCOUNTER — Telehealth: Payer: Self-pay | Admitting: *Deleted

## 2016-02-17 NOTE — Telephone Encounter (Signed)
Called patient to verify Atorvastatin strength.  Patient is currently taking one 40mg  tablet of Atorvastatin daily.  Patient will let us know when she needs more refills.  She currently does not need refills.

## 2016-02-23 ENCOUNTER — Other Ambulatory Visit: Payer: Self-pay | Admitting: *Deleted

## 2016-02-23 NOTE — Telephone Encounter (Signed)
Entry error

## 2016-02-24 DIAGNOSIS — D225 Melanocytic nevi of trunk: Secondary | ICD-10-CM | POA: Diagnosis not present

## 2016-02-24 DIAGNOSIS — Z85828 Personal history of other malignant neoplasm of skin: Secondary | ICD-10-CM | POA: Diagnosis not present

## 2016-02-24 DIAGNOSIS — Z87898 Personal history of other specified conditions: Secondary | ICD-10-CM | POA: Diagnosis not present

## 2016-02-24 DIAGNOSIS — Z23 Encounter for immunization: Secondary | ICD-10-CM | POA: Diagnosis not present

## 2016-02-24 DIAGNOSIS — D2271 Melanocytic nevi of right lower limb, including hip: Secondary | ICD-10-CM | POA: Diagnosis not present

## 2016-02-24 DIAGNOSIS — W57XXXA Bitten or stung by nonvenomous insect and other nonvenomous arthropods, initial encounter: Secondary | ICD-10-CM | POA: Diagnosis not present

## 2016-02-24 DIAGNOSIS — Z86018 Personal history of other benign neoplasm: Secondary | ICD-10-CM | POA: Diagnosis not present

## 2016-02-24 DIAGNOSIS — D2262 Melanocytic nevi of left upper limb, including shoulder: Secondary | ICD-10-CM | POA: Diagnosis not present

## 2016-02-24 DIAGNOSIS — S50862A Insect bite (nonvenomous) of left forearm, initial encounter: Secondary | ICD-10-CM | POA: Diagnosis not present

## 2016-03-24 DIAGNOSIS — Z23 Encounter for immunization: Secondary | ICD-10-CM | POA: Diagnosis not present

## 2016-04-27 ENCOUNTER — Other Ambulatory Visit: Payer: Self-pay | Admitting: Cardiology

## 2016-06-01 DIAGNOSIS — I2510 Atherosclerotic heart disease of native coronary artery without angina pectoris: Secondary | ICD-10-CM | POA: Diagnosis not present

## 2016-06-01 DIAGNOSIS — D039 Melanoma in situ, unspecified: Secondary | ICD-10-CM | POA: Diagnosis not present

## 2016-06-01 DIAGNOSIS — M8580 Other specified disorders of bone density and structure, unspecified site: Secondary | ICD-10-CM | POA: Diagnosis not present

## 2016-06-01 DIAGNOSIS — I10 Essential (primary) hypertension: Secondary | ICD-10-CM | POA: Diagnosis not present

## 2016-06-21 ENCOUNTER — Other Ambulatory Visit: Payer: Self-pay | Admitting: Cardiology

## 2016-06-22 NOTE — Telephone Encounter (Signed)
Rx(s) sent to pharmacy electronically.  

## 2016-07-10 ENCOUNTER — Other Ambulatory Visit: Payer: Self-pay | Admitting: Cardiology

## 2016-07-13 ENCOUNTER — Other Ambulatory Visit: Payer: Self-pay | Admitting: Internal Medicine

## 2016-07-13 DIAGNOSIS — Z1231 Encounter for screening mammogram for malignant neoplasm of breast: Secondary | ICD-10-CM

## 2016-07-20 ENCOUNTER — Other Ambulatory Visit: Payer: Self-pay | Admitting: Cardiology

## 2016-07-21 NOTE — Telephone Encounter (Signed)
Rx(s) sent to pharmacy electronically.  

## 2016-08-04 ENCOUNTER — Other Ambulatory Visit: Payer: Self-pay | Admitting: Cardiology

## 2016-08-04 NOTE — Telephone Encounter (Signed)
REFILL 

## 2016-08-22 ENCOUNTER — Other Ambulatory Visit: Payer: Self-pay | Admitting: Internal Medicine

## 2016-08-22 DIAGNOSIS — M8580 Other specified disorders of bone density and structure, unspecified site: Secondary | ICD-10-CM

## 2016-08-24 ENCOUNTER — Ambulatory Visit
Admission: RE | Admit: 2016-08-24 | Discharge: 2016-08-24 | Disposition: A | Discharge status: Home / Self Care | Payer: Medicare Other | Source: Ambulatory Visit | Attending: Internal Medicine | Admitting: Internal Medicine

## 2016-08-24 ENCOUNTER — Other Ambulatory Visit: Payer: Self-pay | Admitting: Internal Medicine

## 2016-08-24 DIAGNOSIS — Z780 Asymptomatic menopausal state: Secondary | ICD-10-CM | POA: Diagnosis not present

## 2016-08-24 DIAGNOSIS — M8589 Other specified disorders of bone density and structure, multiple sites: Secondary | ICD-10-CM | POA: Diagnosis not present

## 2016-08-24 DIAGNOSIS — Z1231 Encounter for screening mammogram for malignant neoplasm of breast: Secondary | ICD-10-CM

## 2016-08-24 DIAGNOSIS — M8580 Other specified disorders of bone density and structure, unspecified site: Secondary | ICD-10-CM

## 2016-09-01 DIAGNOSIS — Z87898 Personal history of other specified conditions: Secondary | ICD-10-CM | POA: Diagnosis not present

## 2016-09-01 DIAGNOSIS — D2271 Melanocytic nevi of right lower limb, including hip: Secondary | ICD-10-CM | POA: Diagnosis not present

## 2016-09-01 DIAGNOSIS — D225 Melanocytic nevi of trunk: Secondary | ICD-10-CM | POA: Diagnosis not present

## 2016-09-01 DIAGNOSIS — L570 Actinic keratosis: Secondary | ICD-10-CM | POA: Diagnosis not present

## 2016-09-28 ENCOUNTER — Other Ambulatory Visit: Payer: Self-pay | Admitting: Cardiology

## 2016-11-08 ENCOUNTER — Other Ambulatory Visit: Payer: Self-pay | Admitting: Cardiology

## 2016-11-09 NOTE — Telephone Encounter (Signed)
REFILL 

## 2016-12-07 DIAGNOSIS — M8580 Other specified disorders of bone density and structure, unspecified site: Secondary | ICD-10-CM | POA: Diagnosis not present

## 2016-12-07 DIAGNOSIS — R609 Edema, unspecified: Secondary | ICD-10-CM | POA: Diagnosis not present

## 2016-12-07 DIAGNOSIS — I2510 Atherosclerotic heart disease of native coronary artery without angina pectoris: Secondary | ICD-10-CM | POA: Diagnosis not present

## 2016-12-07 DIAGNOSIS — R030 Elevated blood-pressure reading, without diagnosis of hypertension: Secondary | ICD-10-CM | POA: Diagnosis not present

## 2016-12-14 ENCOUNTER — Other Ambulatory Visit: Payer: Self-pay | Admitting: Cardiology

## 2017-01-11 DIAGNOSIS — Z23 Encounter for immunization: Secondary | ICD-10-CM | POA: Diagnosis not present

## 2017-02-06 ENCOUNTER — Other Ambulatory Visit: Payer: Self-pay | Admitting: Cardiology

## 2017-02-14 ENCOUNTER — Other Ambulatory Visit: Payer: Self-pay | Admitting: *Deleted

## 2017-02-14 MED ORDER — ATORVASTATIN CALCIUM 80 MG PO TABS: 80.0000 mg | ORAL_TABLET | Freq: Every day | ORAL | 0 refills | Status: DC

## 2017-02-14 NOTE — Telephone Encounter (Signed)
REFILL 

## 2017-03-01 DIAGNOSIS — H35372 Puckering of macula, left eye: Secondary | ICD-10-CM | POA: Diagnosis not present

## 2017-03-08 ENCOUNTER — Other Ambulatory Visit: Payer: Self-pay | Admitting: Cardiology

## 2017-04-06 ENCOUNTER — Other Ambulatory Visit: Payer: Self-pay | Admitting: Cardiology

## 2017-04-06 NOTE — Telephone Encounter (Signed)
Please advise if okay to refill. Patient was last seen 09/16/2015 and has not followed back up.

## 2017-04-27 ENCOUNTER — Ambulatory Visit: Payer: Medicare Other | Admitting: Cardiology

## 2017-04-27 ENCOUNTER — Encounter: Payer: Self-pay | Admitting: Cardiology

## 2017-04-27 DIAGNOSIS — I2510 Atherosclerotic heart disease of native coronary artery without angina pectoris: Secondary | ICD-10-CM | POA: Diagnosis not present

## 2017-04-27 DIAGNOSIS — E785 Hyperlipidemia, unspecified: Secondary | ICD-10-CM | POA: Insufficient documentation

## 2017-04-27 DIAGNOSIS — I10 Essential (primary) hypertension: Secondary | ICD-10-CM

## 2017-04-27 DIAGNOSIS — Z9861 Coronary angioplasty status: Secondary | ICD-10-CM | POA: Diagnosis not present

## 2017-04-27 MED ORDER — ATORVASTATIN CALCIUM 40 MG PO TABS: 40.0000 mg | ORAL_TABLET | Freq: Every day | ORAL | 3 refills | Status: DC

## 2017-04-27 MED ORDER — METOPROLOL TARTRATE 25 MG PO TABS: ORAL_TABLET | ORAL | 3 refills | Status: DC

## 2017-04-27 NOTE — Assessment & Plan Note (Signed)
On statin- followed by PCP

## 2017-04-27 NOTE — Patient Instructions (Signed)
Medication Instructions:  Your physician recommends that you continue on your current medications as directed. Please refer to the Current Medication list given to you today.  Follow-Up: Your physician wants you to follow-up in: 1 year with Dr. Percival Spanish. You will receive a reminder letter in the mail two months in advance. If you don't receive a letter, please call our office to schedule the follow-up appointment.   Any Other Special Instructions Will Be Listed Below (If Applicable).     If you need a refill on your cardiac medications before your next appointment, please call your pharmacy.

## 2017-04-27 NOTE — Progress Notes (Signed)
04/27/2017 Nancy Wise   07/16/1929  [id number]  Primary Physician Levin Erp, MD Primary Cardiologist: Dr Percival Spanish  HPI:  Pleasant 84 y/o widow, lives in her own townhouse and is active. She plays bridge and Mahjong and does her shopping. She has a history of CAD, s/p prior PCI LAD PCI with DES by Dr Martinique. She had a normal CFX ad 30% RCA. Her EF was normal. Re look in Sept 2015 showed patent coronaries. Dr Nyoka Cowden follows her labs. She is in the office today for a one year follow up. She is doing well. She does not exercise regularly but denies any chest or NTG use.    Current Outpatient Medications  Medication Sig Dispense Refill  . aspirin EC 81 MG tablet Take 81 mg by mouth every morning.    [name] atorvastatin (LIPITOR) 40 MG tablet Take 1 tablet (40 mg total) by mouth daily at 6 PM. 90 tablet 3  . CALCIUM CARBONATE-VITAMIN D PO Take by mouth every morning.    . famotidine (PEPCID) 20 MG tablet Take 1 tablet (20 mg total) by mouth 2 (two) times daily as needed for heartburn.    . hydrochlorothiazide (HYDRODIURIL) 25 MG tablet Take 25 mg by mouth every morning.    [name] KLOR-CON M20 20 MEQ tablet TAKE ONE TABLET BY MOUTH ONCE DAILY 90 tablet 3  . metoprolol tartrate (LOPRESSOR) 25 MG tablet TAKE 1 TABLET BY MOUTH TWICE DAILY NEED OFFICE VISIT 60 tablet 0  . Multiple Vitamins-Minerals (PRESERVISION AREDS 2 PO) Take 1 tablet by mouth daily.    . nitroGLYCERIN (NITROSTAT) 0.4 MG SL tablet DISSOLVE ONE TABLET UNDER THE TONGUE EVERY 5 MINUTES AS NEEDED FOR CHEST PAIN.  DO NOT EXCEED A TOTAL OF 3 DOSES IN 15 MINUTES 25 tablet 0   No current facility-administered medications for this visit.     No Known Allergies  Past Medical History:  Diagnosis Date  . Basal cell carcinoma of nose   . Bladder retention   . Coronary artery disease    a. 09/2012 NSTEMI/Cath/PCI: LM nl, LAD 80-37m (2.25x16 Promus Premier DES), LCX nl, RCA 30-40 diff in PL, 30 PDA, EF 55-60%. b. cath 01/12/2014 EF  preserved EF, widely patent LAD stent, otherwise normal CAD  . Hypertension   . Melanoma of foot ([place])   . Obesity     Social History   Socioeconomic History  . Marital status: Widowed    Spouse name: Not on file  . Number of children: 5  . Years of education: Not on file  . Highest education level: Not on file  Social Needs  . Financial resource strain: Not on file  . Food insecurity - worry: Not on file  . Food insecurity - inability: Not on file  . Transportation needs - medical: Not on file  . Transportation needs - non-medical: Not on file  Occupational History  . Not on file  Tobacco Use  . Smoking status: Never Smoker  . Smokeless tobacco: Never Used  Substance and Sexual Activity  . Alcohol use: No  . Drug use: No  . Sexual activity: No    Birth control/protection: Post-menopausal  Other Topics Concern  . Not on file  Social History Narrative   Lives at home with son.       Family History  Problem Relation Age of Onset  . Cancer Sister 32       Lung     Review of Systems: General: negative for chills,  fever, night sweats or weight changes.  Cardiovascular: negative for chest pain, dyspnea on exertion, edema, orthopnea, palpitations, paroxysmal nocturnal dyspnea or shortness of breath Dermatological: negative for rash Respiratory: negative for cough or wheezing Urologic: negative for hematuria Abdominal: negative for nausea, vomiting, diarrhea, bright red blood per rectum, melena, or hematemesis Neurologic: negative for visual changes, syncope, or dizziness All other systems reviewed and are otherwise negative except as noted above.    Blood pressure 110/66, pulse 68, height 5\' 3"  (1.6 m), weight 163 lb (73.9 kg).  General appearance: alert, cooperative, no distress and mildly obese Neck: no carotid bruit and no JVD Lungs: clear to auscultation bilaterally and kyphosis Heart: regular rate and rhythm Extremities: no edema Skin: warm and  dry Neurologic: Grossly normal  EKG NSR  ASSESSMENT AND PLAN:   CAD S/P percutaneous coronary angioplasty LAD DES June 2015, patent Sept 2015 No angina  Hypertension Controlled  Dyslipidemia On statin- followed by PCP   PLAN  Same Rx. Meds refilled. F/U one year.   Kerin Ransom PA-C 04/27/2017 11:04 AM

## 2017-04-27 NOTE — Assessment & Plan Note (Signed)
Controlled.  

## 2017-04-27 NOTE — Assessment & Plan Note (Signed)
LAD DES June 2015, patent Sept 2015 No angina

## 2017-05-02 NOTE — Addendum Note (Signed)
Addended by: Ulice Brilliant T on: 05/02/2017 01:18 PM   Modules accepted: Orders

## 2017-06-28 ENCOUNTER — Other Ambulatory Visit: Payer: Self-pay | Admitting: Cardiology

## 2017-06-28 NOTE — Telephone Encounter (Signed)
REFILL 

## 2017-07-20 DIAGNOSIS — Z6841 Body Mass Index (BMI) 40.0 and over, adult: Secondary | ICD-10-CM | POA: Diagnosis not present

## 2017-07-20 DIAGNOSIS — I10 Essential (primary) hypertension: Secondary | ICD-10-CM | POA: Diagnosis not present

## 2017-07-20 DIAGNOSIS — C439 Malignant melanoma of skin, unspecified: Secondary | ICD-10-CM | POA: Diagnosis not present

## 2017-07-20 DIAGNOSIS — I2510 Atherosclerotic heart disease of native coronary artery without angina pectoris: Secondary | ICD-10-CM | POA: Diagnosis not present

## 2017-08-13 ENCOUNTER — Other Ambulatory Visit: Payer: Self-pay | Admitting: Internal Medicine

## 2017-08-13 DIAGNOSIS — Z1231 Encounter for screening mammogram for malignant neoplasm of breast: Secondary | ICD-10-CM

## 2017-08-30 ENCOUNTER — Ambulatory Visit
Admission: RE | Admit: 2017-08-30 | Discharge: 2017-08-30 | Disposition: A | Discharge status: Home / Self Care | Payer: Medicare Other | Source: Ambulatory Visit | Attending: Internal Medicine | Admitting: Internal Medicine

## 2017-08-30 DIAGNOSIS — Z1231 Encounter for screening mammogram for malignant neoplasm of breast: Secondary | ICD-10-CM

## 2017-09-13 DIAGNOSIS — Z87898 Personal history of other specified conditions: Secondary | ICD-10-CM | POA: Diagnosis not present

## 2017-09-13 DIAGNOSIS — Z85828 Personal history of other malignant neoplasm of skin: Secondary | ICD-10-CM | POA: Diagnosis not present

## 2017-09-13 DIAGNOSIS — D2271 Melanocytic nevi of right lower limb, including hip: Secondary | ICD-10-CM | POA: Diagnosis not present

## 2017-09-13 DIAGNOSIS — Z86018 Personal history of other benign neoplasm: Secondary | ICD-10-CM | POA: Diagnosis not present

## 2017-12-14 ENCOUNTER — Other Ambulatory Visit: Payer: Self-pay | Admitting: Cardiology

## 2018-01-15 ENCOUNTER — Other Ambulatory Visit: Payer: Self-pay | Admitting: Cardiology

## 2018-01-17 ENCOUNTER — Other Ambulatory Visit: Payer: Self-pay | Admitting: Cardiology

## 2018-02-11 DIAGNOSIS — I2510 Atherosclerotic heart disease of native coronary artery without angina pectoris: Secondary | ICD-10-CM | POA: Diagnosis not present

## 2018-02-11 DIAGNOSIS — Z23 Encounter for immunization: Secondary | ICD-10-CM | POA: Diagnosis not present

## 2018-02-11 DIAGNOSIS — I10 Essential (primary) hypertension: Secondary | ICD-10-CM | POA: Diagnosis not present

## 2018-03-04 DIAGNOSIS — H2511 Age-related nuclear cataract, right eye: Secondary | ICD-10-CM | POA: Diagnosis not present

## 2018-03-04 DIAGNOSIS — H353132 Nonexudative age-related macular degeneration, bilateral, intermediate dry stage: Secondary | ICD-10-CM | POA: Diagnosis not present

## 2018-03-04 DIAGNOSIS — H35372 Puckering of macula, left eye: Secondary | ICD-10-CM | POA: Diagnosis not present

## 2018-03-04 DIAGNOSIS — Z961 Presence of intraocular lens: Secondary | ICD-10-CM | POA: Diagnosis not present

## 2018-05-01 ENCOUNTER — Other Ambulatory Visit: Payer: Self-pay | Admitting: Cardiology

## 2018-05-02 ENCOUNTER — Ambulatory Visit: Payer: Medicare Other | Admitting: Cardiology

## 2018-05-04 NOTE — Progress Notes (Signed)
Cardiology Office Note   Date:  05/06/2018   ID:  Nancy Wise, DOB 1929-09-01, MRN [id number]  PCP:  Levin Erp, MD  Cardiologist:   Minus Breeding, MD Referring:  Levin Erp, MD  Chief Complaint  Patient presents with  . Coronary Artery Disease      History of Present Illness: Nancy Wise is a 85 y.o. female who presents for follow up of CAD She has a history of CAD, s/p prior PCI LAD PCI with DES by Dr Martinique. She had a normal CFX ad 30% RCA. Her EF was normal. Re look in Sept 2015 showed patent coronaries.  Since I last saw her she continues to play mah-jongg and bridge.  She is not exercising at [employer] like she was.  She still drives and does her own shopping.  With this she denies any cardiovascular symptoms.  She has had no chest pressure, neck or arm discomfort.  She is had no palpitations, presyncope or syncope.  She does not need to use nitroglycerin.   Past Medical History:  Diagnosis Date  . Basal cell carcinoma of nose   . Bladder retention   . Coronary artery disease    a. 09/2012 NSTEMI/Cath/PCI: LM nl, LAD 80-14m (2.25x16 Promus Premier DES), LCX nl, RCA 30-40 diff in PL, 30 PDA, EF 55-60%. b. cath 01/12/2014 EF preserved EF, widely patent LAD stent, otherwise normal CAD  . Hypertension   . Melanoma of foot ([place])   . Obesity     Past Surgical History:  Procedure Laterality Date  . BASAL CELL CARCINOMA EXCISION Right 2011?   "nose" (09/30/2012)  . [place]  . CORONARY ANGIOPLASTY WITH STENT PLACEMENT  09/30/2012   "1" (09/30/2012)  . LEFT HEART CATHETERIZATION WITH CORONARY ANGIOGRAM N/A 09/30/2012   Procedure: LEFT HEART CATHETERIZATION WITH CORONARY ANGIOGRAM;  Surgeon: Peter M Martinique, MD;  Location[facility] CATH LAB;  Service: Cardiovascular;  Laterality: N/A;  . LEFT HEART CATHETERIZATION WITH CORONARY ANGIOGRAM N/A 01/12/2014   Procedure: LEFT HEART CATHETERIZATION WITH CORONARY ANGIOGRAM;  Surgeon: Sinclair Grooms, MD;   Location: [facility] CATH LAB;  Service: Cardiovascular;  Laterality: N/A;  . MELANOMA EXCISION Left 2013   'foot" (09/30/2012)  . PERCUTANEOUS CORONARY STENT INTERVENTION (PCI-S)  09/30/2012   Procedure: PERCUTANEOUS CORONARY STENT INTERVENTION (PCI-S);  Surgeon: Peter M Martinique, MD;  Location: [facility] CATH LAB;  Service: Cardiovascular;;  . TONSILLECTOMY  1930's     Current Outpatient Medications  Medication Sig Dispense Refill  . aspirin EC 81 MG tablet Take 81 mg by mouth every morning.    [name] atorvastatin (LIPITOR) 40 MG tablet Take 1 tablet (40 mg total) by mouth daily at 6 PM. 90 tablet 3  . CALCIUM CARBONATE-VITAMIN D PO Take by mouth every morning.    . famotidine (PEPCID) 20 MG tablet Take 1 tablet (20 mg total) by mouth 2 (two) times daily as needed for heartburn.    . hydrochlorothiazide (HYDRODIURIL) 25 MG tablet Take 25 mg by mouth every morning.    . metoprolol tartrate (LOPRESSOR) 25 MG tablet TAKE 1 TABLET BY MOUTH TWICE DAILY 180 tablet 0  . Multiple Vitamins-Minerals (PRESERVISION AREDS 2 PO) Take 1 tablet by mouth daily.    . nitroGLYCERIN (NITROSTAT) 0.4 MG SL tablet DISSOLVE ONE TABLET UNDER THE TONGUE EVERY 5 MINUTES AS NEEDED FOR CHEST PAIN.  DO NOT EXCEED A TOTAL OF 3 DOSES IN 15 MINUTES 25 tablet 6  . potassium chloride SA (  K-DUR,KLOR-CON) 20 MEQ tablet TAKE 1 TABLET BY MOUTH ONCE DAILY 90 tablet 3   No current facility-administered medications for this visit.     Allergies:   Patient has no known allergies.    ROS:  Please see the history of present illness.   Otherwise, review of systems are positive for none.   All other systems are reviewed and negative.    PHYSICAL EXAM: VS:  BP 118/62   Pulse 60   Ht 5\' 3"  (1.6 m)   Wt 156 lb (70.8 kg)   BMI 27.63 kg/m  , BMI Body mass index is 27.63 kg/m. GENERAL:  Well appearing NECK:  No jugular venous distention, waveform within normal limits, carotid upstroke brisk and symmetric, no bruits, no thyromegaly LUNGS:  Clear to  auscultation bilaterally CHEST:  Unremarkable HEART:  PMI not displaced or sustained,S1 and S2 within normal limits, no S3, no S4, no clicks, no rubs, no murmurs ABD:  Flat, positive bowel sounds normal in frequency in pitch, no bruits, no rebound, no guarding, no midline pulsatile mass, no hepatomegaly, no splenomegaly EXT:  2 plus pulses throughout, no edema, no cyanosis no clubbing   EKG:  EKG is ordered today. The ekg ordered today demonstrates sinus rhythm, rate 60, axis within normal limits, intervals within normal limits, no acute ST-T wave changes.   Recent Labs: No results found for requested labs within last 8760 hours.    Lipid Panel    Component Value Date/Time   CHOL 117 01/10/2014 0300   TRIG 32 01/10/2014 0300   HDL 62 01/10/2014 0300   CHOLHDL 1.9 01/10/2014 0300   VLDL 6 01/10/2014 0300   LDLCALC 49 01/10/2014 0300      Wt Readings from Last 3 Encounters:  05/06/18 156 lb (70.8 kg)  04/27/17 163 lb (73.9 kg)  09/16/15 163 lb (73.9 kg)      Other studies Reviewed: Additional studies/ records that were reviewed today include: Labs. Review of the above records demonstrates:  Please see elsewhere in the note.     ASSESSMENT AND PLAN:  CAD S/P percutaneous coronary angioplasty The patient has no new sypmtoms.  No further cardiovascular testing is indicated.  We will continue with aggressive risk reduction and meds as listed.  The only suggestion I have this year was for her to exercise a little bit for core and balance in particular.  Hypertension Her blood pressure is controlled and she will continue the meds as listed.   Dyslipidemia She has an excellent lipid profile.  No change in therapy.    Current medicines are reviewed at length with the patient today.  The patient does not have concerns regarding medicines.  The following changes have been made:  no change  Labs/ tests ordered today include: None  Orders Placed This Encounter  Procedures   . EKG 12-Lead     Disposition:   FU with me in 12 months.     Signed, Minus Breeding, MD  05/06/2018 12:49 PM    [facility]

## 2018-05-06 ENCOUNTER — Encounter: Payer: Self-pay | Admitting: Cardiology

## 2018-05-06 ENCOUNTER — Ambulatory Visit: Payer: Medicare Other | Admitting: Cardiology

## 2018-05-06 VITALS — BP 118/62 | HR 60 | Ht 63.0 in | Wt 156.0 lb

## 2018-05-06 DIAGNOSIS — I2510 Atherosclerotic heart disease of native coronary artery without angina pectoris: Secondary | ICD-10-CM

## 2018-05-06 DIAGNOSIS — I10 Essential (primary) hypertension: Secondary | ICD-10-CM

## 2018-05-06 DIAGNOSIS — E785 Hyperlipidemia, unspecified: Secondary | ICD-10-CM

## 2018-05-06 DIAGNOSIS — Z9861 Coronary angioplasty status: Secondary | ICD-10-CM | POA: Diagnosis not present

## 2018-05-06 NOTE — Patient Instructions (Signed)
Medication Instructions:  Continue current medications  If you need a refill on your cardiac medications before your next appointment, please call your pharmacy.  Labwork: None ordered   Take the provided lab slips with you to the lab for your blood draw.  When you have your labs (blood work) drawn today and your tests are completely normal, you will receive your results only by MyChart Message (if you have MyChart) -OR-  A paper copy in the mail.  If you have any lab test that is abnormal or we need to change your treatment, we will call you to review these results.  Testing/Procedures: None Ordered  Follow-Up: You will need a follow up appointment in 12 months.  Please call our office 2 months in advance to schedule this appointment.  You may see Minus Breeding, MD or one of the following Advanced Practice Providers on your designated Care Team:   Rosaria Ferries, PA-C . Jory Sims, DNP, ANP    At [facility], you and your health needs are our priority.  As part of our continuing mission to provide you with exceptional heart care, we have created designated Provider Care Teams.  These Care Teams include your primary Cardiologist (physician) and Advanced Practice Providers (APPs -  Physician Assistants and Nurse Practitioners) who all work together to provide you with the care you need, when you need it.  Thank you for choosing [facility] at [facility]!!

## 2018-07-30 ENCOUNTER — Other Ambulatory Visit: Payer: Self-pay | Admitting: Cardiology

## 2018-07-30 NOTE — Telephone Encounter (Signed)
NTG SL refilled. 

## 2018-12-09 ENCOUNTER — Other Ambulatory Visit: Payer: Self-pay | Admitting: Cardiology

## 2019-01-28 ENCOUNTER — Other Ambulatory Visit: Payer: Self-pay | Admitting: Cardiology

## 2019-01-29 NOTE — Telephone Encounter (Signed)
Rx request sent to pharmacy.  

## 2019-02-02 ENCOUNTER — Other Ambulatory Visit: Payer: Self-pay | Admitting: Cardiology

## 2019-02-04 ENCOUNTER — Other Ambulatory Visit: Payer: Self-pay

## 2019-02-04 NOTE — Telephone Encounter (Signed)
REFILL 

## 2019-02-06 ENCOUNTER — Other Ambulatory Visit: Payer: Self-pay | Admitting: Cardiology

## 2019-02-10 ENCOUNTER — Other Ambulatory Visit: Payer: Self-pay | Admitting: Cardiology

## 2019-02-11 ENCOUNTER — Other Ambulatory Visit: Payer: Self-pay | Admitting: Cardiology

## 2019-03-10 ENCOUNTER — Other Ambulatory Visit: Payer: Self-pay | Admitting: Cardiology

## 2019-03-14 ENCOUNTER — Other Ambulatory Visit: Payer: Self-pay | Admitting: Cardiology

## 2019-04-29 ENCOUNTER — Other Ambulatory Visit: Payer: Self-pay | Admitting: Cardiology

## 2019-05-04 ENCOUNTER — Other Ambulatory Visit: Payer: Self-pay | Admitting: Cardiology

## 2019-07-21 ENCOUNTER — Other Ambulatory Visit: Payer: Self-pay | Admitting: Cardiology

## 2019-07-30 ENCOUNTER — Encounter: Payer: Self-pay | Admitting: General Practice

## 2019-08-21 ENCOUNTER — Other Ambulatory Visit: Payer: Self-pay | Admitting: Cardiology

## 2019-09-08 ENCOUNTER — Other Ambulatory Visit: Payer: Self-pay | Admitting: Cardiology

## 2019-09-10 ENCOUNTER — Other Ambulatory Visit: Payer: Self-pay | Admitting: Cardiology

## 2019-09-23 ENCOUNTER — Other Ambulatory Visit: Payer: Self-pay | Admitting: Cardiology

## 2019-09-28 ENCOUNTER — Other Ambulatory Visit: Payer: Self-pay | Admitting: Cardiology

## 2019-09-30 ENCOUNTER — Telehealth: Payer: Self-pay | Admitting: Cardiology

## 2019-09-30 ENCOUNTER — Other Ambulatory Visit: Payer: Self-pay

## 2019-09-30 MED ORDER — METOPROLOL TARTRATE 25 MG PO TABS: 25.0000 mg | ORAL_TABLET | Freq: Two times a day (BID) | ORAL | 0 refills | Status: DC

## 2019-09-30 NOTE — Telephone Encounter (Signed)
Pt c/o medication issue:  1. Name of Medication: metoprolol tartrate (LOPRESSOR) 25 MG tablet  2. How are you currently taking this medication (dosage and times per day)? Not currently taking medication   3. Are you having a reaction (difficulty breathing--STAT)? No  4. What is your medication issue? Nancy Wise is calling because she has been trying to get this medication filled for the past 2 weeks with no success. She has not had this medication in over a week due to it not being filled. She states Dr. Percival Spanish has to approve it and if he isn't going to do that she would like a callback on why. Please advise.

## 2019-09-30 NOTE — Telephone Encounter (Signed)
Called and spoke with pt, notified that it was most likely the reason she was not able to get her metoprolol filled is because she had not been seen by Dr. Percival Spanish in over a year. Able to schedule pt for an office visit with Dr.Hochrein on 10/15/19 Notified the refill for her prescription would be sent to her preferred pharmacy. Pt verbalized understanding and had no other questions at this time.   Call pharmacy and spoke with tech, notified a refill for the pt's script had been sent and it was okay to fill it. Pharmacy confirmed receiving medication request. Will fill.

## 2019-10-03 ENCOUNTER — Other Ambulatory Visit: Payer: Self-pay | Admitting: Cardiology

## 2019-10-09 ENCOUNTER — Other Ambulatory Visit: Payer: Self-pay | Admitting: Cardiology

## 2019-10-14 DIAGNOSIS — Z7189 Other specified counseling: Secondary | ICD-10-CM | POA: Insufficient documentation

## 2019-10-14 NOTE — Progress Notes (Signed)
Cardiology Office Note   Date:  10/15/2019   ID:  Nancy Wise, DOB [birth year], MRN [id number]  PCP:  Levin Erp, MD  Cardiologist:   Minus Breeding, MD Referring:  Levin Erp, MD  Chief Complaint  Patient presents with  . Coronary Artery Disease      History of Present Illness: Nancy Wise is a 86 y.o. female who presents for follow up of CAD She has a history of CAD, s/p prior PCI LAD PCI with DES by Dr Martinique. She had a normal CFX ad 30% RCA. Her EF was normal. Re look in Sept 2015 showed patent coronaries.  Since I last saw her she has done well.  She is no longer playing mah-jongg or great because of the virus.  She is not doing Silver sneakers.  However, she climbs stairs to do laundry.  She still drives and does her grocery shopping.  She lives alone and has a son who checks on her. The patient denies any new symptoms such as chest discomfort, neck or arm discomfort. There has been no new shortness of breath, PND or orthopnea. There have been no reported palpitations, presyncope or syncope.   Past Medical History:  Diagnosis Date  . Basal cell carcinoma of nose   . Bladder retention   . Coronary artery disease    a. 09/2012 NSTEMI/Cath/PCI: LM nl, LAD 80-41m (2.25x16 Promus Premier DES), LCX nl, RCA 30-40 diff in PL, 30 PDA, EF 55-60%. b. cath 01/12/2014 EF preserved EF, widely patent LAD stent, otherwise normal CAD  . Hypertension   . Melanoma of foot ([place])   . Obesity     Past Surgical History:  Procedure Laterality Date  . BASAL CELL CARCINOMA EXCISION Right 2011?   "nose" (09/30/2012)  . [place]  . CORONARY ANGIOPLASTY WITH STENT PLACEMENT  09/30/2012   "1" (09/30/2012)  . LEFT HEART CATHETERIZATION WITH CORONARY ANGIOGRAM N/A 09/30/2012   Procedure: LEFT HEART CATHETERIZATION WITH CORONARY ANGIOGRAM;  Surgeon: Peter M Martinique, MD;  Location[facility] CATH LAB;  Service: Cardiovascular;  Laterality: N/A;  . LEFT HEART CATHETERIZATION WITH  CORONARY ANGIOGRAM N/A 01/12/2014   Procedure: LEFT HEART CATHETERIZATION WITH CORONARY ANGIOGRAM;  Surgeon: Sinclair Grooms, MD;  Location: [facility] CATH LAB;  Service: Cardiovascular;  Laterality: N/A;  . MELANOMA EXCISION Left 2013   'foot" (09/30/2012)  . PERCUTANEOUS CORONARY STENT INTERVENTION (PCI-S)  09/30/2012   Procedure: PERCUTANEOUS CORONARY STENT INTERVENTION (PCI-S);  Surgeon: Peter M Martinique, MD;  Location: [facility] CATH LAB;  Service: Cardiovascular;;  . TONSILLECTOMY  1930's     Current Outpatient Medications  Medication Sig Dispense Refill  . aspirin EC 81 MG tablet Take 81 mg by mouth every morning.    [name] atorvastatin (LIPITOR) 40 MG tablet Take 1 tablet (40 mg total) by mouth daily at 6 PM. 90 tablet 3  . atorvastatin (LIPITOR) 80 MG tablet TAKE 1 TABLET BY MOUTH ONCE DAILY AT 6 PMPLEASE MAKE APPOINTMENT FOR REFILLS 30 tablet 0  . CALCIUM CARBONATE-VITAMIN D PO Take by mouth every morning.    . famotidine (PEPCID) 20 MG tablet Take 1 tablet (20 mg total) by mouth 2 (two) times daily as needed for heartburn.    . hydrochlorothiazide (HYDRODIURIL) 25 MG tablet Take 25 mg by mouth every morning.    . metoprolol tartrate (LOPRESSOR) 25 MG tablet Take 1 tablet (25 mg total) by mouth 2 (two) times daily. 60 tablet 0  .  Multiple Vitamins-Minerals (PRESERVISION AREDS 2 PO) Take 1 tablet by mouth daily.    . nitroGLYCERIN (NITROSTAT) 0.4 MG SL tablet DISSOLVE ONE TABLET UNDER THE TONGUE EVERY 5 MINUTES AS NEEDED FOR CHEST PAIN.  DO NOT EXCEED A TOTAL OF 3 DOSES IN 15 MINUTES 25 tablet 3  . potassium chloride SA (KLOR-CON) 20 MEQ tablet Take 1 tablet (20 mEq total) by mouth daily. 30 tablet 1   No current facility-administered medications for this visit.    Allergies:   Patient has no known allergies.    ROS:  Please see the history of present illness.   Otherwise, review of systems are positive for none.   All other systems are reviewed and negative.    PHYSICAL EXAM: VS:  BP 125/60    Pulse 60   Temp (!) 95.2 F (35.1 C)   Ht 5\' 3"  (1.6 m)   Wt 146 lb (66.2 kg)   SpO2 94%   BMI 25.86 kg/m  , BMI Body mass index is 25.86 kg/m. GENERAL:  Well appearing NECK:  No jugular venous distention, waveform within normal limits, carotid upstroke brisk and symmetric, no bruits, no thyromegaly LUNGS:  Clear to auscultation bilaterally CHEST:  Unremarkable HEART:  PMI not displaced or sustained,S1 and S2 within normal limits, no S3, no S4, no clicks, no rubs, no murmurs ABD:  Flat, positive bowel sounds normal in frequency in pitch, no bruits, no rebound, no guarding, no midline pulsatile mass, no hepatomegaly, no splenomegaly EXT:  2 plus pulses throughout, no edema, no cyanosis no clubbing   EKG:  EKG is  ordered today. The ekg ordered today demonstrates sinus rhythm, rate 60, axis within normal limits, intervals within normal limits, no acute ST-T wave changes.   Recent Labs: No results found for requested labs within last 8760 hours.    Lipid Panel    Component Value Date/Time   CHOL 117 01/10/2014 0300   TRIG 32 01/10/2014 0300   HDL 62 01/10/2014 0300   CHOLHDL 1.9 01/10/2014 0300   VLDL 6 01/10/2014 0300   LDLCALC 49 01/10/2014 0300      Wt Readings from Last 3 Encounters:  10/15/19 146 lb (66.2 kg)  05/06/18 156 lb (70.8 kg)  04/27/17 163 lb (73.9 kg)      Other studies Reviewed: Additional studies/ records that were reviewed today include: Labs. Review of the above records demonstrates:  Please see elsewhere in the note.     ASSESSMENT AND PLAN:  CAD S/P percutaneous coronary angioplasty The patient has no new sypmtoms.  No further cardiovascular testing is indicated.  We will continue with aggressive risk reduction and meds as listed.  Hypertension Her blood pressure is at target.  No change in therapy.   Dyslipidemia LDL was slightly above target previously.  She can have this checked by her primary provider.  I think with her HDL being 53  that the ratio is acceptable.  No change in therapy.  Covid education She has had her vaccine.   Current medicines are reviewed at length with the patient today.  The patient does not have concerns regarding medicines.  The following changes have been made:  no change  Labs/ tests ordered today include: None  Orders Placed This Encounter  Procedures  . EKG 12-Lead     Disposition:   FU with me in 12 months.     Signed, Minus Breeding, MD  10/15/2019 3:29 PM    [facility]

## 2019-10-15 ENCOUNTER — Ambulatory Visit (INDEPENDENT_AMBULATORY_CARE_PROVIDER_SITE_OTHER): Payer: Medicare Other | Admitting: Cardiology

## 2019-10-15 ENCOUNTER — Encounter: Payer: Self-pay | Admitting: Cardiology

## 2019-10-15 ENCOUNTER — Other Ambulatory Visit: Payer: Self-pay

## 2019-10-15 VITALS — BP 125/60 | HR 60 | Temp 95.2°F | Ht 63.0 in | Wt 146.0 lb

## 2019-10-15 DIAGNOSIS — I2510 Atherosclerotic heart disease of native coronary artery without angina pectoris: Secondary | ICD-10-CM | POA: Diagnosis not present

## 2019-10-15 DIAGNOSIS — E785 Hyperlipidemia, unspecified: Secondary | ICD-10-CM

## 2019-10-15 DIAGNOSIS — I10 Essential (primary) hypertension: Secondary | ICD-10-CM | POA: Diagnosis not present

## 2019-10-15 DIAGNOSIS — Z7189 Other specified counseling: Secondary | ICD-10-CM

## 2019-10-15 NOTE — Patient Instructions (Signed)
Medication Instructions:  Your physician recommends that you continue on your current medications as directed. Please refer to the Current Medication list given to you today.  *If you need a refill on your cardiac medications before your next appointment, please call your pharmacy*  Lab Work: NONE  Testing/Procedures: NONE  Follow-Up: At [facility], you and your health needs are our priority.  As part of our continuing mission to provide you with exceptional heart care, we have created designated Provider Care Teams.  These Care Teams include your primary Cardiologist (physician) and Advanced Practice Providers (APPs -  Physician Assistants and Nurse Practitioners) who all work together to provide you with the care you need, when you need it.  We recommend signing up for the patient portal called "MyChart".  Sign up information is provided on this After Visit Summary.  MyChart is used to connect with patients for Virtual Visits (Telemedicine).  Patients are able to view lab/test results, encounter notes, upcoming appointments, etc.  Non-urgent messages can be sent to your provider as well.   To learn more about what you can do with MyChart, go to [email or web address].    Your next appointment:   12 month(s) You will receive a reminder letter in the mail two months in advance. If you don't receive a letter, please call our office to schedule the follow-up appointment.  The format for your next appointment:   In Person  Provider:   You may see Minus Breeding, MD   or one of the following Advanced Practice Providers on your designated Care Team:    Rosaria Ferries, PA-C  Jory Sims, DNP, ANP  Cadence Kathlen Mody, NP

## 2019-10-28 ENCOUNTER — Other Ambulatory Visit: Payer: Self-pay | Admitting: Cardiology

## 2019-12-03 ENCOUNTER — Other Ambulatory Visit: Payer: Self-pay | Admitting: Cardiology

## 2019-12-31 ENCOUNTER — Other Ambulatory Visit: Payer: Self-pay | Admitting: Cardiology

## 2020-04-19 ENCOUNTER — Other Ambulatory Visit: Payer: Self-pay

## 2020-04-19 ENCOUNTER — Emergency Department (HOSPITAL_COMMUNITY)
Admission: EM | Admit: 2020-04-19 | Discharge: 2020-04-20 | Disposition: A | Discharge status: Home / Self Care | Payer: Medicare Other | Attending: Emergency Medicine | Admitting: Emergency Medicine

## 2020-04-19 ENCOUNTER — Emergency Department (HOSPITAL_COMMUNITY): Payer: Medicare Other

## 2020-04-19 ENCOUNTER — Encounter (HOSPITAL_COMMUNITY): Payer: Self-pay | Admitting: Emergency Medicine

## 2020-04-19 DIAGNOSIS — W19XXXA Unspecified fall, initial encounter: Secondary | ICD-10-CM | POA: Diagnosis not present

## 2020-04-19 DIAGNOSIS — S0990XA Unspecified injury of head, initial encounter: Secondary | ICD-10-CM | POA: Diagnosis present

## 2020-04-19 DIAGNOSIS — Z5321 Procedure and treatment not carried out due to patient leaving prior to being seen by health care provider: Secondary | ICD-10-CM | POA: Diagnosis not present

## 2020-04-19 DIAGNOSIS — S0083XA Contusion of other part of head, initial encounter: Secondary | ICD-10-CM | POA: Diagnosis not present

## 2020-04-19 DIAGNOSIS — Y92480 Sidewalk as the place of occurrence of the external cause: Secondary | ICD-10-CM | POA: Insufficient documentation

## 2020-04-19 NOTE — ED Triage Notes (Signed)
Pt presents to ED POV. Pt c/o mechanical fall impact to sidewalk. Pt denies LOC, no blood thinners. Pt had large hematoma to R forehead. AAO at baseline.

## 2020-04-19 NOTE — ED Notes (Signed)
Pt called x 3  No answer. 

## 2020-05-14 ENCOUNTER — Other Ambulatory Visit: Payer: Self-pay | Admitting: Cardiology

## 2020-07-25 ENCOUNTER — Other Ambulatory Visit: Payer: Self-pay | Admitting: Cardiology

## 2020-08-03 ENCOUNTER — Telehealth: Payer: Self-pay | Admitting: Cardiology

## 2020-08-03 MED ORDER — HYDROCHLOROTHIAZIDE 25 MG PO TABS
25.0000 mg | ORAL_TABLET | Freq: Every morning | ORAL | 0 refills | Status: DC
Start: 2020-08-03 — End: 2020-11-23

## 2020-08-03 NOTE — Telephone Encounter (Signed)
*  STAT* If patient is at the pharmacy, call can be transferred to refill team.   1. Which medications need to be refilled? (please list name of each medication and dose if known)  hydrochlorothiazide (HYDRODIURIL) 25 MG tablet  2. Which pharmacy/location (including street and city if local pharmacy) is medication to be sent to? [place], [place]  3. Do they need a 30 day or 90 day supply? 30  Patient is out of medication

## 2020-08-03 NOTE — Telephone Encounter (Signed)
Spoke to patient --  Patient states she has been taking medication but has been out for 2 today. Patient last appointment was 10/15/19 with Dr Percival Spanish.  RN asked patient if she would like a 30 day or 90 day prescription.  RN  Informed patient she had a recall for annual appointment in June  2021   patient states she would like an enough until she see Dr Percival Spanish at next visit -  RN offered to make A June appointment. Patient decline and states she will call back. RN informed patient medication refill will be good for only 3 months until she makes an appointment. Patient verbalized understanding.

## 2020-09-29 ENCOUNTER — Other Ambulatory Visit: Payer: Self-pay | Admitting: Cardiology

## 2020-10-28 ENCOUNTER — Other Ambulatory Visit: Payer: Self-pay | Admitting: Cardiology

## 2020-11-22 ENCOUNTER — Telehealth: Payer: Self-pay | Admitting: Cardiology

## 2020-11-22 NOTE — Telephone Encounter (Signed)
*  STAT* If patient is at the pharmacy, call can be transferred to refill team.   1. Which medications need to be refilled? (please list name of each medication and dose if known) hydrochlorothiazide (HYDRODIURIL) 25 MG tablet  2. Which pharmacy/location (including street and city if local pharmacy) is medication to be sent to? [place], [place]  3. Do they need a 30 day or 90 day supply? 90 day supply Pt has an upcoming appt with Dr. Percival Spanish 02/24/21

## 2020-11-23 MED ORDER — HYDROCHLOROTHIAZIDE 25 MG PO TABS
25.0000 mg | ORAL_TABLET | Freq: Every morning | ORAL | 0 refills | Status: DC
Start: 2020-11-23 — End: 2021-02-14

## 2020-11-25 ENCOUNTER — Other Ambulatory Visit: Payer: Self-pay | Admitting: Cardiology

## 2020-11-28 ENCOUNTER — Other Ambulatory Visit: Payer: Self-pay | Admitting: Cardiology

## 2020-11-29 ENCOUNTER — Other Ambulatory Visit: Payer: Self-pay | Admitting: Cardiology

## 2020-12-06 ENCOUNTER — Other Ambulatory Visit: Payer: Self-pay | Admitting: Cardiology

## 2021-01-31 ENCOUNTER — Other Ambulatory Visit: Payer: Self-pay | Admitting: Cardiology

## 2021-02-14 ENCOUNTER — Other Ambulatory Visit: Payer: Self-pay | Admitting: Cardiology

## 2021-02-22 NOTE — Progress Notes (Signed)
Cardiology Office Note   Date:  02/24/2021   ID:  Nancy Wise, DOB 01-Mar-1930, MRN [id number]  PCP:  Michael Boston, MD  Cardiologist:   Minus Breeding, MD Referring:  Michael Boston, MD  Chief Complaint  Patient presents with   Coronary Artery Disease       History of Present Illness: Nancy Wise is a 87 y.o. female who presents for follow up of CAD She has a history of CAD, s/p prior PCI LAD PCI with DES by Dr Martinique. She had a normal CFX ad 30% RCA. Her EF was normal. Re look in Sept 2015 showed patent coronaries.  Since I last saw her she has done okay.  She lives alone.  She has some solitary and sedentary activities and longer participating in some of the exercise she did before COVID.  Or having her mah-jongg group.  The patient denies any new symptoms such as chest discomfort, neck or arm discomfort. There has been no new shortness of breath, PND or orthopnea. There have been no reported palpitations, presyncope or syncope.    She still walks up to the second floor to do her laundry.  She drives her self.  She does her own chores.   Past Medical History:  Diagnosis Date   Basal cell carcinoma of nose    Bladder retention    Coronary artery disease    a. 09/2012 NSTEMI/Cath/PCI: LM nl, LAD 80-56m (2.25x16 Promus Premier DES), LCX nl, RCA 30-40 diff in PL, 30 PDA, EF 55-60%. b. cath 01/12/2014 EF preserved EF, widely patent LAD stent, otherwise normal CAD   Hypertension    Melanoma of foot ([place])    Obesity     Past Surgical History:  Procedure Laterality Date   BASAL CELL CARCINOMA EXCISION Right 2011?   "nose" (09/30/2012)   [place]  09/30/2012   "1" (09/30/2012)   LEFT HEART CATHETERIZATION WITH CORONARY ANGIOGRAM N/A 09/30/2012   Procedure: LEFT HEART CATHETERIZATION WITH CORONARY ANGIOGRAM;  Surgeon: Peter M Martinique, MD;  Location: [facility] CATH LAB;  Service: Cardiovascular;  Laterality: N/A;   LEFT  HEART CATHETERIZATION WITH CORONARY ANGIOGRAM N/A 01/12/2014   Procedure: LEFT HEART CATHETERIZATION WITH CORONARY ANGIOGRAM;  Surgeon: Sinclair Grooms, MD;  Location[facility] C[facility] LAB;  Service: Cardiovascular;  Laterality: N/A;   MELANOMA EXCISION Left 2013   'foot" (09/30/2012)   PERCUTANEOUS CORONARY STENT INTERVENTION (PCI-S)  09/30/2012   Procedure: PERCUTANEOUS CORONARY STENT INTERVENTION (PCI-S);  Surgeon: Peter M Martinique, MD;  Location: [facility] CATH LAB;  Service: Cardiovascular;;   TONSILLECTOMY  1930's     Current Outpatient Medications  Medication Sig Dispense Refill   aspirin EC 81 MG tablet Take 81 mg by mouth every morning.     atorvastatin (LIPITOR) 40 MG tablet Take 1 tablet (40 mg total) by mouth daily at 6 PM. 90 tablet 3   atorvastatin (LIPITOR) 80 MG tablet TAKE 1 TABLET BY MOUTH ONCE DAILY AT 6 PMPLEASE MAKE APPOINTMENT FOR REFILLS 30 tablet 0   CALCIUM CARBONATE-VITAMIN D PO Take by mouth every morning.     famotidine (PEPCID) 20 MG tablet Take 1 tablet (20 mg total) by mouth 2 (two) times daily as needed for heartburn.     hydrochlorothiazide (HYDRODIURIL) 25 MG tablet Take 25 mg by mouth every morning.     metoprolol tartrate (LOPRESSOR) 25 MG tablet Take 1 tablet (25 mg total)  by mouth 2 (two) times daily. 60 tablet 0   Multiple Vitamins-Minerals (PRESERVISION AREDS 2 PO) Take 1 tablet by mouth daily.     nitroGLYCERIN (NITROSTAT) 0.4 MG SL tablet DISSOLVE ONE TABLET UNDER THE TONGUE EVERY 5 MINUTES AS NEEDED FOR CHEST PAIN.  DO NOT EXCEED A TOTAL OF 3 DOSES IN 15 MINUTES 25 tablet 3   potassium chloride SA (KLOR-CON) 20 MEQ tablet Take 1 tablet (20 mEq total) by mouth daily. 30 tablet 1   No current facility-administered medications for this visit.    Allergies:   Patient has no known allergies.    ROS:  Please see the history of present illness.   Otherwise, review of systems are positive for none.   All other systems are reviewed and negative.    PHYSICAL EXAM: VS:  BP  128/78   Pulse (!) 59   Ht 5\' 3"  (1.6 m)   Wt 140 lb 9.6 oz (63.8 kg)   SpO2 95%   BMI 24.91 kg/m  , BMI Body mass index is 24.91 kg/m. GENERAL:  Well appearing NECK:  No jugular venous distention, waveform within normal limits, carotid upstroke brisk and symmetric, no bruits, no thyromegaly LUNGS:  Clear to auscultation bilaterally CHEST:  Unremarkable HEART:  PMI not displaced or sustained,S1 and S2 within normal limits, no S3, no S4, no clicks, no rubs, no murmurs ABD:  Flat, positive bowel sounds normal in frequency in pitch, no bruits, no rebound, no guarding, no midline pulsatile mass, no hepatomegaly, no splenomegaly EXT:  2 plus pulses throughout, no edema, no cyanosis no clubbing    EKG:  EKG is  ordered today. The ekg ordered today demonstrates sinus rhythm, rate 56, axis within normal limits, intervals within normal limits, no acute ST-T wave changes.   Recent Labs: No results found for requested labs within last 8760 hours.    Lipid Panel    Component Value Date/Time   CHOL 117 01/10/2014 0300   TRIG 32 01/10/2014 0300   HDL 62 01/10/2014 0300   CHOLHDL 1.9 01/10/2014 0300   VLDL 6 01/10/2014 0300   LDLCALC 49 01/10/2014 0300      Wt Readings from Last 3 Encounters:  02/24/21 140 lb 9.6 oz (63.8 kg)  10/15/19 146 lb (66.2 kg)  05/06/18 156 lb (70.8 kg)      Other studies Reviewed: Additional studies/ records that were reviewed today include: None. Review of the above records demonstrates:  NA   ASSESSMENT AND PLAN:  CAD S/P percutaneous coronary angioplasty The patient has no new sypmtoms.  No further cardiovascular testing is indicated.  We will continue with aggressive risk reduction and meds as listed.  Hypertension The blood pressure is at target. No change in medications is indicated. We will continue with therapeutic lifestyle changes (TLC).  Dyslipidemia LDL has not been checked in a couple years.  Despite the fact that this is nonfasting  I will check a lipid profile while she is here today.    Current medicines are reviewed at length with the patient today.  The patient does not have concerns regarding medicines.  The following changes have been made:  None  Labs/ tests ordered today include:   None  Orders Placed This Encounter  Procedures   Lipid panel   EKG 12-Lead      Disposition:   FU with me in 12 months.     Signed, Minus Breeding, MD  02/24/2021 2:31 PM    [facility]

## 2021-02-24 ENCOUNTER — Ambulatory Visit (INDEPENDENT_AMBULATORY_CARE_PROVIDER_SITE_OTHER): Payer: Medicare Other | Admitting: Cardiology

## 2021-02-24 ENCOUNTER — Encounter: Payer: Self-pay | Admitting: Cardiology

## 2021-02-24 ENCOUNTER — Other Ambulatory Visit: Payer: Self-pay

## 2021-02-24 VITALS — BP 128/78 | HR 59 | Ht 63.0 in | Wt 140.6 lb

## 2021-02-24 DIAGNOSIS — I2510 Atherosclerotic heart disease of native coronary artery without angina pectoris: Secondary | ICD-10-CM | POA: Diagnosis not present

## 2021-02-24 DIAGNOSIS — E785 Hyperlipidemia, unspecified: Secondary | ICD-10-CM

## 2021-02-24 DIAGNOSIS — I10 Essential (primary) hypertension: Secondary | ICD-10-CM | POA: Diagnosis not present

## 2021-02-24 MED ORDER — ATORVASTATIN CALCIUM 40 MG PO TABS: 40.0000 mg | ORAL_TABLET | Freq: Every day | ORAL | 3 refills | Status: DC

## 2021-02-24 MED ORDER — METOPROLOL TARTRATE 25 MG PO TABS: ORAL_TABLET | ORAL | 3 refills | Status: AC

## 2021-02-24 MED ORDER — HYDROCHLOROTHIAZIDE 25 MG PO TABS: 25.0000 mg | ORAL_TABLET | Freq: Every morning | ORAL | 3 refills | Status: AC

## 2021-02-24 MED ORDER — POTASSIUM CHLORIDE CRYS ER 20 MEQ PO TBCR: 20.0000 meq | EXTENDED_RELEASE_TABLET | Freq: Once | ORAL | 3 refills | Status: DC

## 2021-02-24 NOTE — Patient Instructions (Signed)
Medication Instructions:  The current medical regimen is effective;  continue present plan and medications.  *If you need a refill on your cardiac medications before your next appointment, please call your pharmacy*   Lab Work: LIPID today   If you have labs (blood work) drawn today and your tests are completely normal, you will receive your results only by: [place] (if you have MyChart) OR A paper copy in the mail If you have any lab test that is abnormal or we need to change your treatment, we will call you to review the results.   Follow-Up: At [facility], you and your health needs are our priority.  As part of our continuing mission to provide you with exceptional heart care, we have created designated Provider Care Teams.  These Care Teams include your primary Cardiologist (physician) and Advanced Practice Providers (APPs -  Physician Assistants and Nurse Practitioners) who all work together to provide you with the care you need, when you need it.  We recommend signing up for the patient portal called "MyChart".  Sign up information is provided on this After Visit Summary.  MyChart is used to connect with patients for Virtual Visits (Telemedicine).  Patients are able to view lab/test results, encounter notes, upcoming appointments, etc.  Non-urgent messages can be sent to your provider as well.   To learn more about what you can do with MyChart, go to [email or web address].    Your next appointment:   12 month(s)  The format for your next appointment:   In Person  Provider:   Minus Breeding, MD

## 2021-02-25 LAB — LIPID PANEL
Chol/HDL Ratio: 2.4 ratio (ref 0.0–4.4)
Cholesterol, Total: 154 mg/dL (ref 100–199)
HDL: 65 mg/dL (ref >39)
LDL Chol Calc (NIH): 76 mg/dL (ref 0–99)
Triglycerides: 66 mg/dL (ref 0–149)
VLDL Cholesterol Cal: 13 mg/dL (ref 5–40)

## 2021-03-09 ENCOUNTER — Telehealth: Payer: Self-pay | Admitting: *Deleted

## 2021-03-09 ENCOUNTER — Encounter: Payer: Self-pay | Admitting: *Deleted

## 2021-03-09 DIAGNOSIS — I2510 Atherosclerotic heart disease of native coronary artery without angina pectoris: Secondary | ICD-10-CM

## 2021-03-09 DIAGNOSIS — E785 Hyperlipidemia, unspecified: Secondary | ICD-10-CM

## 2021-03-09 MED ORDER — ATORVASTATIN CALCIUM 80 MG PO TABS: 80.0000 mg | ORAL_TABLET | Freq: Every day | ORAL | 3 refills | Status: DC

## 2021-03-09 NOTE — Telephone Encounter (Signed)
-----   Message from Minus Breeding, MD sent at 02/27/2021  7:40 PM EST ----- Increase Lipitor to 80 mg po daily.  Repeat lipid profile in 10 weeks.

## 2021-03-09 NOTE — Telephone Encounter (Signed)
Letter of results sent to pt  New script sent to the pharmacy  Lab orders mailed to the pt

## 2021-05-11 ENCOUNTER — Other Ambulatory Visit: Payer: Self-pay | Admitting: Cardiology

## 2021-05-14 ENCOUNTER — Other Ambulatory Visit: Payer: Self-pay | Admitting: Cardiology

## 2021-05-27 ENCOUNTER — Encounter: Payer: Self-pay | Admitting: *Deleted

## 2021-08-31 ENCOUNTER — Other Ambulatory Visit: Payer: Self-pay | Admitting: Cardiology

## 2021-11-30 ENCOUNTER — Other Ambulatory Visit: Payer: Self-pay | Admitting: Cardiology

## 2022-01-25 ENCOUNTER — Other Ambulatory Visit: Payer: Self-pay | Admitting: Internal Medicine

## 2022-01-25 DIAGNOSIS — S0990XA Unspecified injury of head, initial encounter: Secondary | ICD-10-CM

## 2022-01-26 ENCOUNTER — Ambulatory Visit
Admission: RE | Admit: 2022-01-26 | Discharge: 2022-01-26 | Disposition: A | Discharge status: Home / Self Care | Payer: Medicare Other | Source: Ambulatory Visit | Attending: Internal Medicine | Admitting: Internal Medicine

## 2022-01-26 DIAGNOSIS — S0990XA Unspecified injury of head, initial encounter: Secondary | ICD-10-CM

## 2022-05-24 ENCOUNTER — Other Ambulatory Visit: Payer: Self-pay | Admitting: Cardiology

## 2022-05-24 DIAGNOSIS — E785 Hyperlipidemia, unspecified: Secondary | ICD-10-CM

## 2022-05-24 DIAGNOSIS — I2510 Atherosclerotic heart disease of native coronary artery without angina pectoris: Secondary | ICD-10-CM

## 2022-06-01 ENCOUNTER — Other Ambulatory Visit: Payer: Self-pay | Admitting: Cardiology

## 2022-06-01 DIAGNOSIS — E785 Hyperlipidemia, unspecified: Secondary | ICD-10-CM

## 2022-06-01 DIAGNOSIS — I2510 Atherosclerotic heart disease of native coronary artery without angina pectoris: Secondary | ICD-10-CM

## 2022-06-02 ENCOUNTER — Other Ambulatory Visit: Payer: Self-pay | Admitting: Cardiology

## 2022-06-02 DIAGNOSIS — I2510 Atherosclerotic heart disease of native coronary artery without angina pectoris: Secondary | ICD-10-CM

## 2022-06-02 DIAGNOSIS — E785 Hyperlipidemia, unspecified: Secondary | ICD-10-CM

## 2022-06-12 ENCOUNTER — Other Ambulatory Visit: Payer: Self-pay

## 2022-11-27 ENCOUNTER — Other Ambulatory Visit: Payer: Self-pay | Admitting: Cardiology

## 2022-12-21 IMAGING — CT CT HEAD W/O CM
4 series · 17 of 47 positions shown, 19 images · non-contrast
Comparison: 01/13/2015

CLINICAL DATA: Fell, facial trauma

EXAM:
CT HEAD WITHOUT CONTRAST
TECHNIQUE: Contiguous axial images were obtained from the base of the skull
through the vertex without intravenous contrast.

[Series 3: head wo · axial · 0.46mm/px · z∈[-146,-26]mm · 7 of 33 slices shown, 9 images]
[im 5/33  brain]
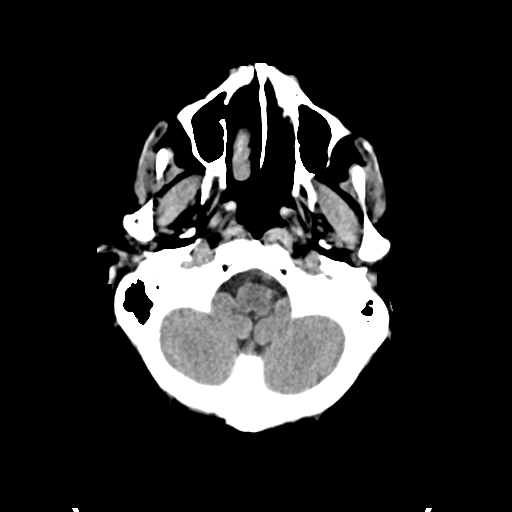
[im 5/33  bone]
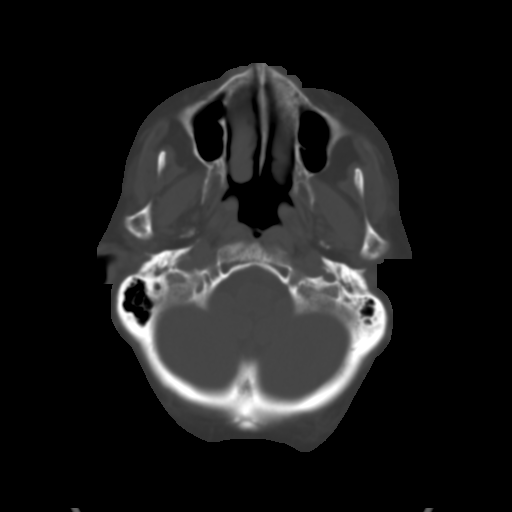
[im 9/33  brain]
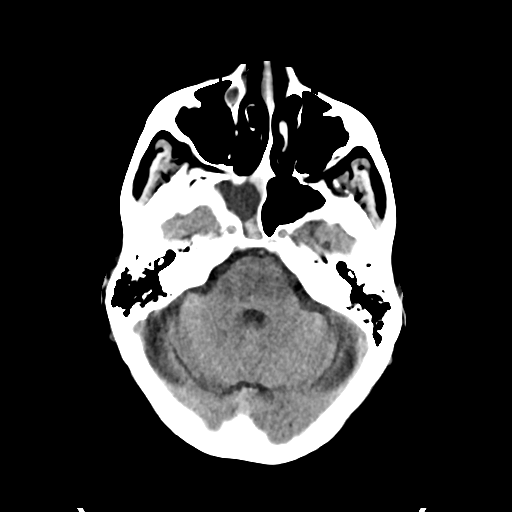
[im 13/33  brain]
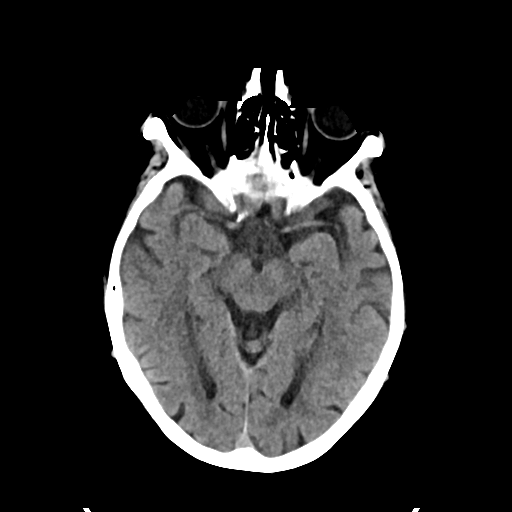
[im 17/33  brain]
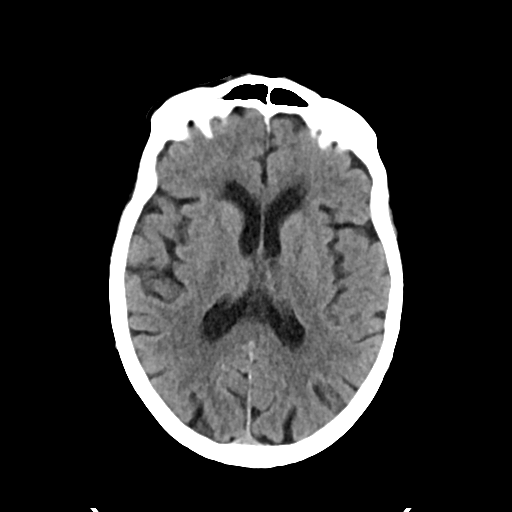
[im 21/33  brain]
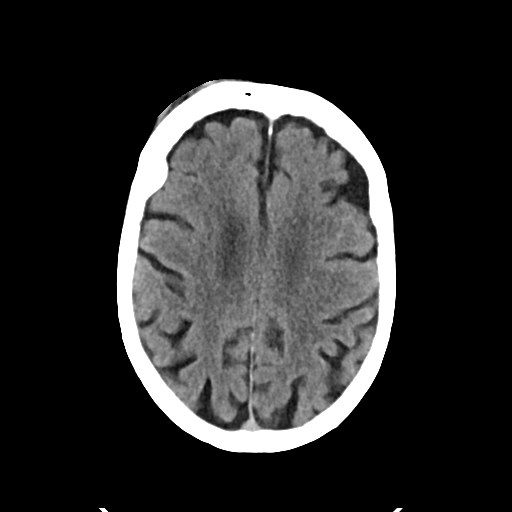
[im 21/33  bone]
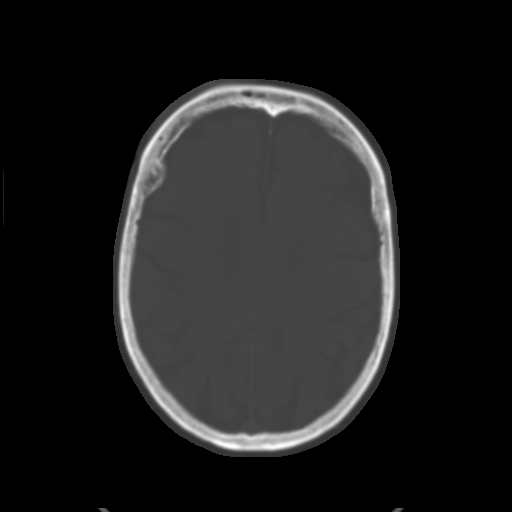
[im 25/33  brain]
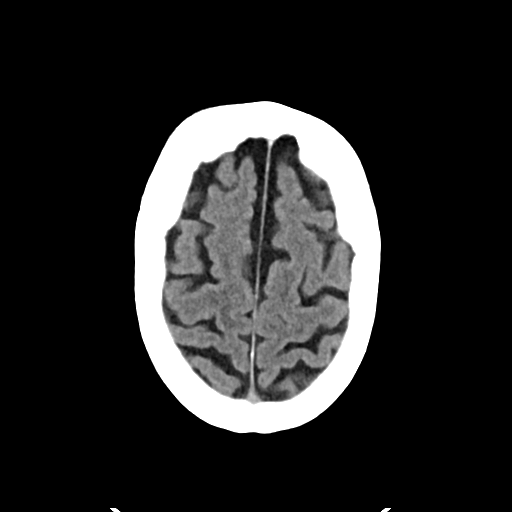
[im 29/33  brain]
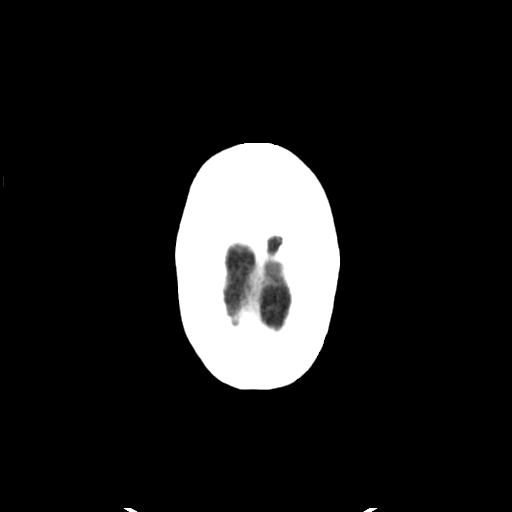

[Series 4: head bone · axial · 0.46mm/px · z∈[-150,-94]mm · 4 of 81 slices shown]
[im 9/81  bone]
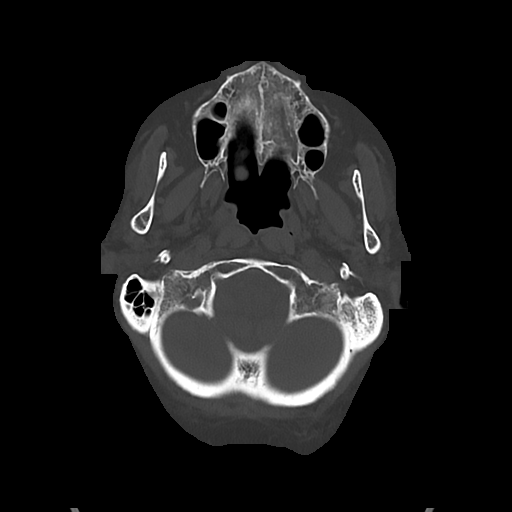
[im 17/81  bone]
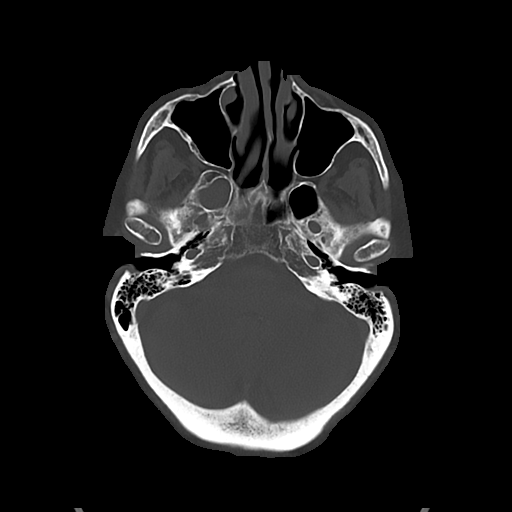
[im 25/81  bone]
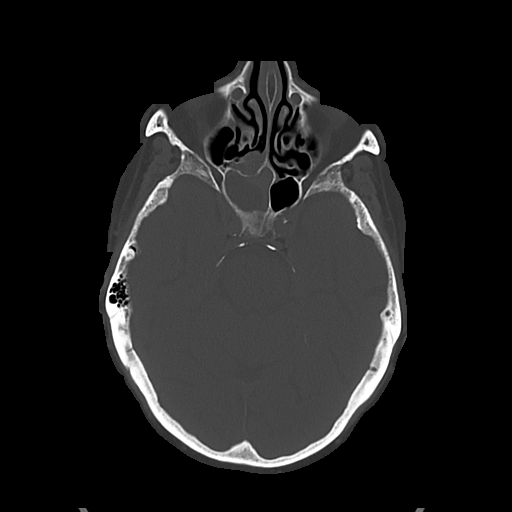
[im 37/81  bone]
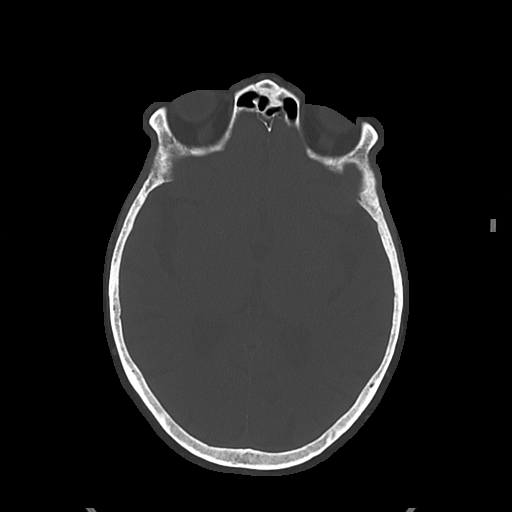

[Series 5: cor soft · coronal · 0.33mm/px · 3 of 71 slices shown]
[im 24/71  brain]
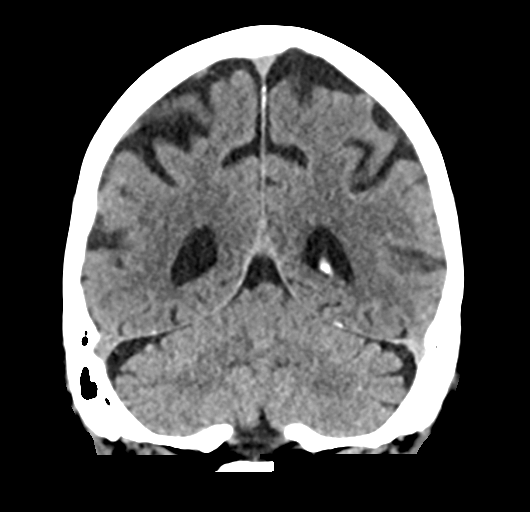
[im 32/71  brain]
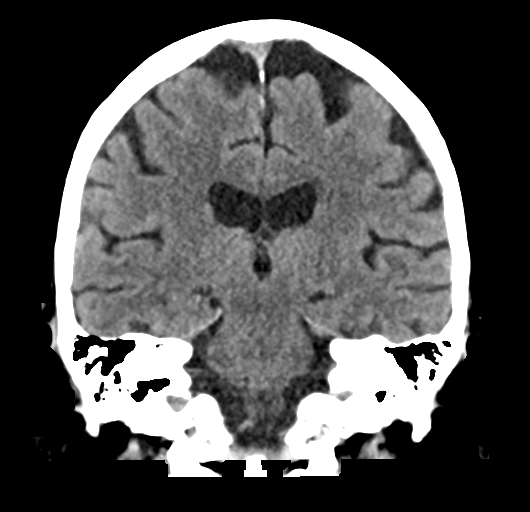
[im 39/71  brain]
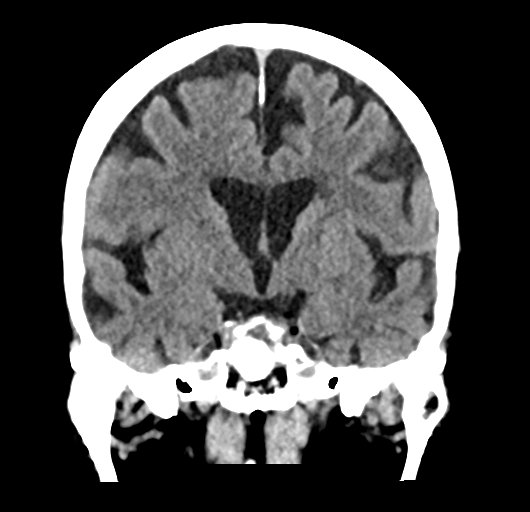

[Series 6: sag soft · sagittal · 0.39mm/px · 3 of 59 slices shown]
[im 20/59  brain]
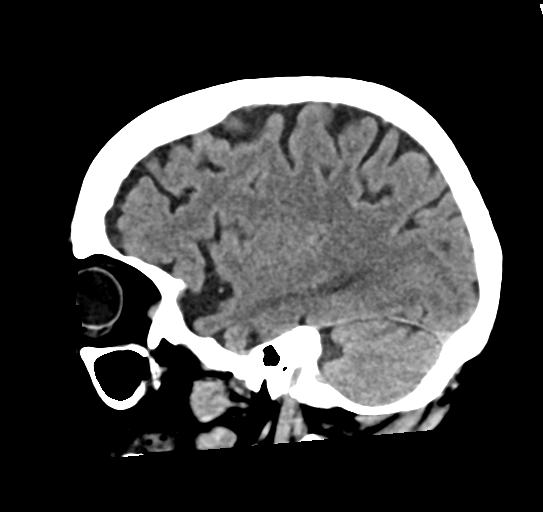
[im 30/59  brain]
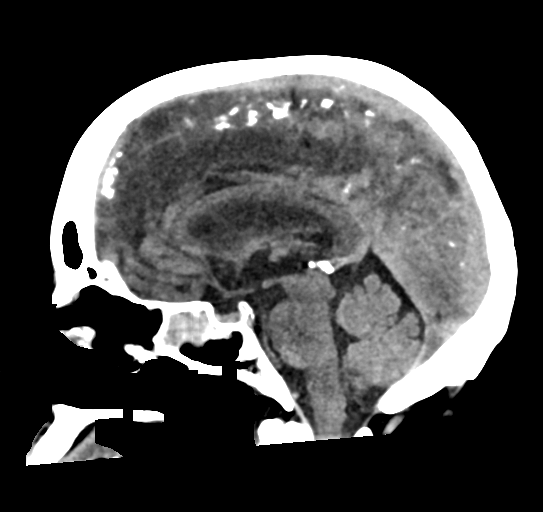
[im 39/59  brain]
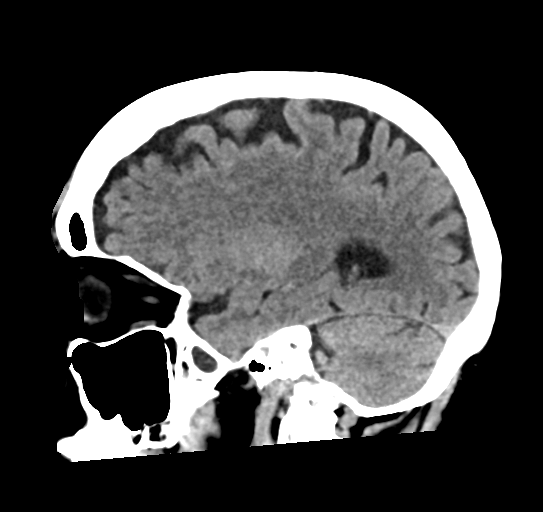

[17 of 47 positions shown; findings below may reference images not displayed]

FINDINGS: Brain: No acute infarct or hemorrhage. Lateral ventricles and
midline structures are unremarkable. No acute extra-axial fluid
collections. No mass effect.

Vascular: No hyperdense vessel or unexpected calcification.

Skull: There is a right frontal scalp hematoma. No underlying
fracture. The remainder of the calvarium is unremarkable.

Sinuses/Orbits: There is opacification of the right sphenoid sinus,
with high attenuation consistent with inspissated secretions. No
acute displaced fractures.

Other: None.
IMPRESSION: 1. Right frontal scalp hematoma.
2. No acute intracranial process.
3. Evidence of chronic right sphenoid sinus disease.

## 2023-01-01 ENCOUNTER — Other Ambulatory Visit: Payer: Self-pay | Admitting: Cardiology

## 2023-02-02 DIAGNOSIS — I10 Essential (primary) hypertension: Secondary | ICD-10-CM | POA: Diagnosis not present

## 2023-02-02 DIAGNOSIS — N289 Disorder of kidney and ureter, unspecified: Secondary | ICD-10-CM | POA: Diagnosis not present

## 2023-02-02 DIAGNOSIS — I25119 Atherosclerotic heart disease of native coronary artery with unspecified angina pectoris: Secondary | ICD-10-CM | POA: Diagnosis not present

## 2023-02-08 ENCOUNTER — Other Ambulatory Visit: Payer: Self-pay | Admitting: Cardiology

## 2023-06-15 DIAGNOSIS — H9190 Unspecified hearing loss, unspecified ear: Secondary | ICD-10-CM | POA: Diagnosis not present

## 2023-06-15 DIAGNOSIS — Z23 Encounter for immunization: Secondary | ICD-10-CM | POA: Diagnosis not present

## 2023-06-15 DIAGNOSIS — Z0000 Encounter for general adult medical examination without abnormal findings: Secondary | ICD-10-CM | POA: Diagnosis not present

## 2023-06-15 DIAGNOSIS — I10 Essential (primary) hypertension: Secondary | ICD-10-CM | POA: Diagnosis not present

## 2023-06-15 DIAGNOSIS — I25119 Atherosclerotic heart disease of native coronary artery with unspecified angina pectoris: Secondary | ICD-10-CM | POA: Diagnosis not present

## 2023-08-20 DIAGNOSIS — S40911A Unspecified superficial injury of right shoulder, initial encounter: Secondary | ICD-10-CM | POA: Diagnosis not present

## 2023-08-20 DIAGNOSIS — W19XXXA Unspecified fall, initial encounter: Secondary | ICD-10-CM | POA: Diagnosis not present

## 2023-10-17 DIAGNOSIS — D225 Melanocytic nevi of trunk: Secondary | ICD-10-CM | POA: Diagnosis not present

## 2023-10-17 DIAGNOSIS — L814 Other melanin hyperpigmentation: Secondary | ICD-10-CM | POA: Diagnosis not present

## 2023-10-17 DIAGNOSIS — L821 Other seborrheic keratosis: Secondary | ICD-10-CM | POA: Diagnosis not present

## 2023-12-26 DIAGNOSIS — M898X1 Other specified disorders of bone, shoulder: Secondary | ICD-10-CM | POA: Diagnosis not present

## 2023-12-26 DIAGNOSIS — R400 Somnolence: Secondary | ICD-10-CM | POA: Diagnosis not present

## 2023-12-26 DIAGNOSIS — W19XXXA Unspecified fall, initial encounter: Secondary | ICD-10-CM | POA: Diagnosis not present

## 2023-12-26 DIAGNOSIS — I10 Essential (primary) hypertension: Secondary | ICD-10-CM | POA: Diagnosis not present
